# Patient Record
Sex: Female | Born: 1970 | Race: Black or African American | Hispanic: No | Marital: Single | Smoking: Never smoker
Health system: Southern US, Community
[De-identification: ages and names within clinical notes are randomized; demographics above are authoritative.]

## PROBLEM LIST (undated history)

## (undated) DIAGNOSIS — C801 Malignant (primary) neoplasm, unspecified: Secondary | ICD-10-CM

## (undated) DIAGNOSIS — E119 Type 2 diabetes mellitus without complications: Secondary | ICD-10-CM

## (undated) HISTORY — PX: ABDOMINAL HYSTERECTOMY: SHX81

## (undated) HISTORY — PX: REDUCTION MAMMAPLASTY: SUR839

## (undated) HISTORY — PX: KIDNEY SURGERY: SHX687

---

## 2006-09-12 ENCOUNTER — Encounter: Admission: RE | Admit: 2006-09-12 | Discharge: 2006-09-12 | Payer: Self-pay | Admitting: Family Medicine

## 2006-11-15 ENCOUNTER — Encounter: Admission: RE | Admit: 2006-11-15 | Discharge: 2006-11-15 | Payer: Self-pay | Admitting: Family Medicine

## 2010-11-05 ENCOUNTER — Encounter: Payer: Self-pay | Admitting: Urology

## 2011-04-09 ENCOUNTER — Ambulatory Visit: Payer: Self-pay | Admitting: Internal Medicine

## 2011-04-15 ENCOUNTER — Ambulatory Visit: Payer: Self-pay | Admitting: Internal Medicine

## 2011-05-16 ENCOUNTER — Ambulatory Visit: Payer: Self-pay | Admitting: Internal Medicine

## 2011-06-16 ENCOUNTER — Ambulatory Visit: Payer: Self-pay | Admitting: Internal Medicine

## 2013-04-11 ENCOUNTER — Emergency Department (HOSPITAL_COMMUNITY): Payer: Worker's Compensation

## 2013-04-11 ENCOUNTER — Emergency Department (HOSPITAL_COMMUNITY)
Admission: EM | Admit: 2013-04-11 | Discharge: 2013-04-11 | Disposition: A | Discharge status: Home / Self Care | Payer: Worker's Compensation | Attending: Emergency Medicine | Admitting: Emergency Medicine

## 2013-04-11 ENCOUNTER — Encounter (HOSPITAL_COMMUNITY): Payer: Self-pay | Admitting: Emergency Medicine

## 2013-04-11 DIAGNOSIS — R110 Nausea: Secondary | ICD-10-CM | POA: Insufficient documentation

## 2013-04-11 DIAGNOSIS — H532 Diplopia: Secondary | ICD-10-CM | POA: Insufficient documentation

## 2013-04-11 DIAGNOSIS — W1809XA Striking against other object with subsequent fall, initial encounter: Secondary | ICD-10-CM | POA: Insufficient documentation

## 2013-04-11 DIAGNOSIS — Y990 Civilian activity done for income or pay: Secondary | ICD-10-CM | POA: Insufficient documentation

## 2013-04-11 DIAGNOSIS — Z85528 Personal history of other malignant neoplasm of kidney: Secondary | ICD-10-CM | POA: Insufficient documentation

## 2013-04-11 DIAGNOSIS — S060X0A Concussion without loss of consciousness, initial encounter: Secondary | ICD-10-CM | POA: Insufficient documentation

## 2013-04-11 DIAGNOSIS — Y9289 Other specified places as the place of occurrence of the external cause: Secondary | ICD-10-CM | POA: Insufficient documentation

## 2013-04-11 DIAGNOSIS — Y9389 Activity, other specified: Secondary | ICD-10-CM | POA: Insufficient documentation

## 2013-04-11 DIAGNOSIS — R42 Dizziness and giddiness: Secondary | ICD-10-CM | POA: Insufficient documentation

## 2013-04-11 DIAGNOSIS — Z79899 Other long term (current) drug therapy: Secondary | ICD-10-CM | POA: Insufficient documentation

## 2013-04-11 HISTORY — DX: Malignant (primary) neoplasm, unspecified: C80.1

## 2013-04-11 MED ORDER — ONDANSETRON 8 MG PO TBDP
8.0000 mg | ORAL_TABLET | Freq: Once | ORAL | Status: AC
  Administered 2013-04-11: 8 mg via ORAL
  Filled 2013-04-11: qty 1

## 2013-04-11 MED ORDER — OXYCODONE-ACETAMINOPHEN 5-325 MG PO TABS
1.0000 | ORAL_TABLET | Freq: Once | ORAL | Status: AC
  Administered 2013-04-11: 1 via ORAL
  Filled 2013-04-11: qty 1

## 2013-04-11 MED ORDER — ONDANSETRON HCL 4 MG PO TABS: 4.0000 mg | ORAL_TABLET | Freq: Four times a day (QID) | ORAL | Status: DC

## 2013-04-11 NOTE — ED Notes (Signed)
As per EMS , pt was at work when she slipped and fell striking her head on floor, No LOC,N/V, neck pain. Pt was seeing double vision.pt is alert and orientated x4

## 2013-04-11 NOTE — ED Provider Notes (Signed)
History    CSN: [id number] Arrival date & time 04/11/13  5284  First MD Initiated Contact with Patient 04/11/13 [id number]     Chief Complaint  Patient presents with  . Fall   (Consider location/radiation/quality/duration/timing/severity/associated sxs/prior Treatment) HPI Comments: Patient is a 42 year old female past medical history significant for renal cancer presenting to the emergency department after slipping at work and falling and striking the back of her head around 4AM today. Patient denies any loss of consciousness or neck pain/stiffness. Patient states she has associated dizziness, double vision, nausea. Patient states dizziness double vision and improved but remains nauseous. Patient states she is nonradiating posterior throbbing head pain that she rates 10 out of 10 with no alleviating or aggravating factors. Patient denies any history of head trauma in the past. Patient denies any chest pain shortness of breath or feeling unwell prior to fall. Patient is not on any blood thinners. Denies fevers or chills.     Past Medical History  Diagnosis Date  . Cancer    Past Surgical History  Procedure Laterality Date  . Kidney surgery     No family history on file. History  Substance Use Topics  . Smoking status: Never Smoker   . Smokeless tobacco: Never Used  . Alcohol Use: No   OB History   Grav Para Term Preterm Abortions TAB SAB Ect Mult Living                 Review of Systems  Constitutional: Negative for fever and chills.  HENT: Negative for neck pain and neck stiffness.   Eyes: Negative for visual disturbance.  Respiratory: Negative for shortness of breath.   Cardiovascular: Negative for chest pain.  Gastrointestinal: Positive for nausea.  Genitourinary: Negative.   Musculoskeletal: Negative.   Skin: Negative.   Neurological: Positive for headaches. Negative for syncope, speech difficulty, weakness and numbness.    Allergies  Review of patient's allergies  indicates no known allergies.  Home Medications   Current Outpatient Rx  Name  Route  Sig  Dispense  Refill  . cholecalciferol (VITAMIN D) 1000 UNITS tablet   Oral   Take 1,000 Units by mouth every morning.         . tamsulosin (FLOMAX) 0.4 MG CAPS   Oral   Take 0.4 mg by mouth daily after supper.         . ondansetron (ZOFRAN) 4 MG tablet   Oral   Take 1 tablet (4 mg total) by mouth every 6 (six) hours.   12 tablet   0    BP 150/66  Pulse 71  Temp(Src) 98.6 F (37 C) (Oral)  Resp 18  Ht 5\' 6"  (1.676 m)  Wt 230 lb (104.327 kg)  BMI 37.14 kg/m2  SpO2 95%  LMP 03/21/2013 Physical Exam  Constitutional: She is oriented to person, place, and time. She appears well-developed and well-nourished. No distress.  HENT:  Head: Normocephalic and atraumatic.  Eyes: Conjunctivae and EOM are normal. Pupils are equal, round, and reactive to light.  Neck: Normal range of motion and full passive range of motion without pain. Neck supple. No spinous process tenderness and no muscular tenderness present.  Cardiovascular: Normal rate, regular rhythm, normal heart sounds and intact distal pulses.   Pulmonary/Chest: Effort normal and breath sounds normal. No respiratory distress.  Abdominal: Soft. Bowel sounds are normal. There is no tenderness.  Musculoskeletal: She exhibits no tenderness.  Lymphadenopathy:    She has no cervical adenopathy.  Neurological:  She is alert and oriented to person, place, and time. She has normal strength. No cranial nerve deficit or sensory deficit. Gait normal. GCS eye subscore is 4. GCS verbal subscore is 5. GCS motor subscore is 6.  No pronator drift. Heel-knee-shin intact bilaterally. Able to ambulate w/o difficulty.   Skin: Skin is warm and dry. She is not diaphoretic.    ED Course  Procedures (including critical care time)  Medications  ondansetron (ZOFRAN-ODT) disintegrating tablet 8 mg (not administered)  oxyCODONE-acetaminophen  (PERCOCET/ROXICET) 5-325 MG per tablet 1 tablet (not administered)  oxyCODONE-acetaminophen (PERCOCET/ROXICET) 5-325 MG per tablet 1 tablet (1 tablet Oral Given 04/11/13 0547)  ondansetron (ZOFRAN-ODT) disintegrating tablet 8 mg (8 mg Oral Given 04/11/13 0555)   Symptoms managed in ED.   Labs Reviewed - No data to display Ct Head Wo Contrast  04/11/2013   *RADIOLOGY REPORT*  Clinical Data: Trauma to head.  Episode of diplopia.  CT HEAD WITHOUT CONTRAST  Technique:  Contiguous axial images were obtained from the base of the skull through the vertex without contrast.  Comparison: None.  Findings: Mild soft tissue swelling is present in the posterior parietal scalp near the vertex.  There is no underlying fracture. The paranasal sinuses and mastoid air cells are clear.  The osseous skull is intact.  No acute infarct, hemorrhage, or mass lesion is present.  The ventricles are of normal size.  No significant extra-axial fluid collection is present.  IMPRESSION:  1.  Normal CT appearance of the brain. 2.  Mild posterior parietal scalp soft tissue swelling without an underlying fracture.   Original Report Authenticated By: Marin Roberts, M.D.   1. Concussion without loss of consciousness, initial encounter     MDM  GCS 15, A&Ox4, no bleeding from the head, battle signs, or clear discharge resembling CSF fluid.  No focal neurological deficits on physical exam. CT image negative. Pt is hemodynamically stable. Pain managed in the ED. At this time there does not appear to be any evidence of an acute emergency medical condition and the patient appears stable for discharge with appropriate outpatient follow up as patient states she has a PCP to f/u w/. Discussed returning to the ED upon presentation of any concerning symptoms and the dangers and symptoms of post-concussive syndrome (including but not limited to severe headaches, disequilibrium/difficulty walking, double vision, difficulty concentrating,  sensitivity to light, changes in mood, nausea/vomiting, ongoing dizziness) as well as second-impact syndrome and how that can lead to devastating brain injury. Discussed the importance of patient being symptom free for at least one week and being cleared by their primary care physician before returning to sports and if symptoms return upon exertion to stop activity immediately and follow up with their doctor or return to ED. Pt verbalized understanding and is agreeable to discharge. Pt case discussed with Dr. Patria Mane who agrees with my plan. Patient is stable at time of discharge.      Jeannetta Ellis, PA-C 04/11/13 [phone]

## 2013-04-11 NOTE — ED Notes (Signed)
[id number]> Expected date:<BR> Expected time:<BR> Means of arrival:<BR> Comments:<BR> EMS - 41 F Slip &amp; Fall

## 2013-04-12 NOTE — ED Provider Notes (Signed)
Medical screening examination/treatment/procedure(s) were performed by non-physician practitioner and as supervising physician I was immediately available for consultation/collaboration.  Derald Lorge M Davontae Prusinski, MD 04/12/13 0026 

## 2013-04-13 ENCOUNTER — Telehealth (HOSPITAL_COMMUNITY): Payer: Self-pay | Admitting: Emergency Medicine

## 2018-12-09 ENCOUNTER — Other Ambulatory Visit: Payer: Self-pay | Admitting: Obstetrics & Gynecology

## 2018-12-09 ENCOUNTER — Other Ambulatory Visit: Payer: Self-pay | Admitting: Obstetrics and Gynecology

## 2018-12-09 DIAGNOSIS — Z1231 Encounter for screening mammogram for malignant neoplasm of breast: Secondary | ICD-10-CM

## 2018-12-10 ENCOUNTER — Ambulatory Visit
Admission: RE | Admit: 2018-12-10 | Discharge: 2018-12-10 | Disposition: A | Discharge status: Home / Self Care | Payer: 59 | Source: Ambulatory Visit | Attending: Obstetrics & Gynecology | Admitting: Obstetrics & Gynecology

## 2018-12-10 DIAGNOSIS — Z1231 Encounter for screening mammogram for malignant neoplasm of breast: Secondary | ICD-10-CM

## 2018-12-12 ENCOUNTER — Other Ambulatory Visit: Payer: Self-pay | Admitting: Obstetrics & Gynecology

## 2018-12-12 DIAGNOSIS — R928 Other abnormal and inconclusive findings on diagnostic imaging of breast: Secondary | ICD-10-CM

## 2018-12-16 ENCOUNTER — Ambulatory Visit
Admission: RE | Admit: 2018-12-16 | Discharge: 2018-12-16 | Disposition: A | Discharge status: Home / Self Care | Payer: 59 | Source: Ambulatory Visit | Attending: Obstetrics & Gynecology | Admitting: Obstetrics & Gynecology

## 2018-12-16 ENCOUNTER — Other Ambulatory Visit: Payer: Self-pay | Admitting: Obstetrics & Gynecology

## 2018-12-16 ENCOUNTER — Other Ambulatory Visit: Payer: Self-pay

## 2018-12-16 DIAGNOSIS — R590 Localized enlarged lymph nodes: Secondary | ICD-10-CM

## 2018-12-16 DIAGNOSIS — R928 Other abnormal and inconclusive findings on diagnostic imaging of breast: Secondary | ICD-10-CM

## 2018-12-23 ENCOUNTER — Other Ambulatory Visit: Payer: Self-pay | Admitting: Obstetrics & Gynecology

## 2019-03-20 ENCOUNTER — Other Ambulatory Visit: Payer: Self-pay

## 2019-09-13 ENCOUNTER — Encounter (HOSPITAL_COMMUNITY): Payer: Self-pay | Admitting: Obstetrics and Gynecology

## 2019-09-13 ENCOUNTER — Emergency Department (HOSPITAL_COMMUNITY): Payer: BC Managed Care – PPO

## 2019-09-13 ENCOUNTER — Inpatient Hospital Stay (HOSPITAL_COMMUNITY)
Admission: EM | Admit: 2019-09-13 | Discharge: 2019-09-17 | DRG: 871 | Disposition: A | Discharge status: Home / Self Care | Payer: BC Managed Care – PPO | Attending: Internal Medicine | Admitting: Internal Medicine

## 2019-09-13 ENCOUNTER — Other Ambulatory Visit: Payer: Self-pay

## 2019-09-13 DIAGNOSIS — U071 COVID-19: Secondary | ICD-10-CM | POA: Diagnosis present

## 2019-09-13 DIAGNOSIS — R1013 Epigastric pain: Secondary | ICD-10-CM

## 2019-09-13 DIAGNOSIS — Y92230 Patient room in hospital as the place of occurrence of the external cause: Secondary | ICD-10-CM | POA: Diagnosis present

## 2019-09-13 DIAGNOSIS — J1289 Other viral pneumonia: Secondary | ICD-10-CM | POA: Diagnosis present

## 2019-09-13 DIAGNOSIS — N390 Urinary tract infection, site not specified: Secondary | ICD-10-CM | POA: Diagnosis present

## 2019-09-13 DIAGNOSIS — Z6831 Body mass index (BMI) 31.0-31.9, adult: Secondary | ICD-10-CM | POA: Diagnosis not present

## 2019-09-13 DIAGNOSIS — N83209 Unspecified ovarian cyst, unspecified side: Secondary | ICD-10-CM

## 2019-09-13 DIAGNOSIS — E669 Obesity, unspecified: Secondary | ICD-10-CM | POA: Diagnosis present

## 2019-09-13 DIAGNOSIS — E1169 Type 2 diabetes mellitus with other specified complication: Secondary | ICD-10-CM

## 2019-09-13 DIAGNOSIS — A419 Sepsis, unspecified organism: Secondary | ICD-10-CM | POA: Diagnosis not present

## 2019-09-13 DIAGNOSIS — A0839 Other viral enteritis: Secondary | ICD-10-CM | POA: Diagnosis present

## 2019-09-13 DIAGNOSIS — A4189 Other specified sepsis: Secondary | ICD-10-CM | POA: Diagnosis present

## 2019-09-13 DIAGNOSIS — E119 Type 2 diabetes mellitus without complications: Secondary | ICD-10-CM

## 2019-09-13 DIAGNOSIS — E876 Hypokalemia: Secondary | ICD-10-CM | POA: Diagnosis present

## 2019-09-13 DIAGNOSIS — N83201 Unspecified ovarian cyst, right side: Secondary | ICD-10-CM | POA: Diagnosis present

## 2019-09-13 DIAGNOSIS — Z79899 Other long term (current) drug therapy: Secondary | ICD-10-CM | POA: Diagnosis not present

## 2019-09-13 DIAGNOSIS — Z794 Long term (current) use of insulin: Secondary | ICD-10-CM

## 2019-09-13 DIAGNOSIS — T380X5A Adverse effect of glucocorticoids and synthetic analogues, initial encounter: Secondary | ICD-10-CM | POA: Diagnosis present

## 2019-09-13 DIAGNOSIS — J189 Pneumonia, unspecified organism: Secondary | ICD-10-CM

## 2019-09-13 DIAGNOSIS — J1282 Pneumonia due to coronavirus disease 2019: Secondary | ICD-10-CM | POA: Diagnosis present

## 2019-09-13 DIAGNOSIS — E0965 Drug or chemical induced diabetes mellitus with hyperglycemia: Secondary | ICD-10-CM | POA: Diagnosis present

## 2019-09-13 DIAGNOSIS — R109 Unspecified abdominal pain: Secondary | ICD-10-CM

## 2019-09-13 HISTORY — DX: Type 2 diabetes mellitus without complications: E11.9

## 2019-09-13 LAB — HEPATIC FUNCTION PANEL
ALT: 26 U/L (ref 0–44)
AST: 42 U/L — ABNORMAL HIGH (ref 15–41)
Albumin: 2.8 g/dL — ABNORMAL LOW (ref 3.5–5.0)
Alkaline Phosphatase: 41 U/L (ref 38–126)
Bilirubin, Direct: 0.2 mg/dL (ref 0.0–0.2)
Indirect Bilirubin: 1.8 mg/dL — ABNORMAL HIGH (ref 0.3–0.9)
Total Bilirubin: 2 mg/dL — ABNORMAL HIGH (ref 0.3–1.2)
Total Protein: 5.8 g/dL — ABNORMAL LOW (ref 6.5–8.1)

## 2019-09-13 LAB — CBC
HCT: 44.1 % (ref 36.0–46.0)
HCT: 40.4 % (ref 36.0–46.0)
Hemoglobin: 14.5 g/dL (ref 12.0–15.0)
Hemoglobin: 13.1 g/dL (ref 12.0–15.0)
MCH: 29.8 pg (ref 26.0–34.0)
MCH: 29 pg (ref 26.0–34.0)
MCHC: 32.9 g/dL (ref 30.0–36.0)
MCHC: 32.4 g/dL (ref 30.0–36.0)
MCV: 90.6 fL (ref 80.0–100.0)
MCV: 89.6 fL (ref 80.0–100.0)
Platelets: 124 10*3/uL — ABNORMAL LOW (ref 150–400)
Platelets: 115 10*3/uL — ABNORMAL LOW (ref 150–400)
RBC: 4.87 MIL/uL (ref 3.87–5.11)
RBC: 4.51 MIL/uL (ref 3.87–5.11)
RDW: 12.3 % (ref 11.5–15.5)
RDW: 12.4 % (ref 11.5–15.5)
WBC: 5.6 10*3/uL (ref 4.0–10.5)
WBC: 5.5 10*3/uL (ref 4.0–10.5)
nRBC: 0 % (ref 0.0–0.2)
nRBC: 0 % (ref 0.0–0.2)

## 2019-09-13 LAB — URINALYSIS, ROUTINE W REFLEX MICROSCOPIC
Bilirubin Urine: NEGATIVE
Glucose, UA: >=500 mg/dL — AB
Hgb urine dipstick: NEGATIVE
Ketones, ur: 80 mg/dL — AB
Nitrite: NEGATIVE
Protein, ur: 100 mg/dL — AB
Specific Gravity, Urine: 1.023 (ref 1.005–1.030)
pH: 6 (ref 5.0–8.0)

## 2019-09-13 LAB — D-DIMER, QUANTITATIVE: D-Dimer, Quant: 1.42 ug/mL-FEU — ABNORMAL HIGH (ref 0.00–0.50)

## 2019-09-13 LAB — I-STAT BETA HCG BLOOD, ED (MC, WL, AP ONLY): I-stat hCG, quantitative: <5 m[IU]/mL (ref <5)

## 2019-09-13 LAB — LACTATE DEHYDROGENASE: LDH: 365 U/L — ABNORMAL HIGH (ref 98–192)

## 2019-09-13 LAB — COMPREHENSIVE METABOLIC PANEL
ALT: 34 U/L (ref 0–44)
AST: 47 U/L — ABNORMAL HIGH (ref 15–41)
Albumin: 3.4 g/dL — ABNORMAL LOW (ref 3.5–5.0)
Alkaline Phosphatase: 53 U/L (ref 38–126)
Anion gap: 13 (ref 5–15)
BUN: 11 mg/dL (ref 6–20)
CO2: 21 mmol/L — ABNORMAL LOW (ref 22–32)
Calcium: 8.4 mg/dL — ABNORMAL LOW (ref 8.9–10.3)
Chloride: 99 mmol/L (ref 98–111)
Creatinine, Ser: 0.95 mg/dL (ref 0.44–1.00)
GFR calc Af Amer: >60 mL/min (ref >60)
GFR calc non Af Amer: >60 mL/min (ref >60)
Glucose, Bld: 261 mg/dL — ABNORMAL HIGH (ref 70–99)
Potassium: 3.2 mmol/L — ABNORMAL LOW (ref 3.5–5.1)
Sodium: 133 mmol/L — ABNORMAL LOW (ref 135–145)
Total Bilirubin: 0.7 mg/dL (ref 0.3–1.2)
Total Protein: 7.3 g/dL (ref 6.5–8.1)

## 2019-09-13 LAB — ABO/RH: ABO/RH(D): A POS

## 2019-09-13 LAB — INFLUENZA PANEL BY PCR (TYPE A & B)
Influenza A By PCR: NEGATIVE
Influenza B By PCR: NEGATIVE

## 2019-09-13 LAB — C-REACTIVE PROTEIN: CRP: 5.2 mg/dL — ABNORMAL HIGH (ref <1.0)

## 2019-09-13 LAB — PROCALCITONIN: Procalcitonin: <0.1 ng/mL

## 2019-09-13 LAB — PHOSPHORUS: Phosphorus: 3.1 mg/dL (ref 2.5–4.6)

## 2019-09-13 LAB — PROTIME-INR
INR: 1.1 (ref 0.8–1.2)
Prothrombin Time: 13.8 seconds (ref 11.4–15.2)

## 2019-09-13 LAB — CREATININE, SERUM
Creatinine, Ser: 0.83 mg/dL (ref 0.44–1.00)
GFR calc Af Amer: >60 mL/min (ref >60)
GFR calc non Af Amer: >60 mL/min (ref >60)

## 2019-09-13 LAB — TROPONIN I (HIGH SENSITIVITY)
Troponin I (High Sensitivity): 20 ng/L — ABNORMAL HIGH (ref <18)
Troponin I (High Sensitivity): 24 ng/L — ABNORMAL HIGH (ref <18)

## 2019-09-13 LAB — CBG MONITORING, ED
Glucose-Capillary: 250 mg/dL — ABNORMAL HIGH (ref 70–99)
Glucose-Capillary: 253 mg/dL — ABNORMAL HIGH (ref 70–99)
Glucose-Capillary: 272 mg/dL — ABNORMAL HIGH (ref 70–99)

## 2019-09-13 LAB — LACTIC ACID, PLASMA
Lactic Acid, Venous: 1 mmol/L (ref 0.5–1.9)
Lactic Acid, Venous: 0.9 mmol/L (ref 0.5–1.9)

## 2019-09-13 LAB — MAGNESIUM: Magnesium: 1.7 mg/dL (ref 1.7–2.4)

## 2019-09-13 LAB — POC SARS CORONAVIRUS 2 AG -  ED: SARS Coronavirus 2 Ag: POSITIVE — AB

## 2019-09-13 LAB — GLUCOSE, CAPILLARY: Glucose-Capillary: 347 mg/dL — ABNORMAL HIGH (ref 70–99)

## 2019-09-13 LAB — APTT: aPTT: 29 seconds (ref 24–36)

## 2019-09-13 LAB — HIV ANTIBODY (ROUTINE TESTING W REFLEX): HIV Screen 4th Generation wRfx: NONREACTIVE

## 2019-09-13 LAB — FERRITIN: Ferritin: 760 ng/mL — ABNORMAL HIGH (ref 11–307)

## 2019-09-13 MED ORDER — VANCOMYCIN HCL IN DEXTROSE 1-5 GM/200ML-% IV SOLN
1000.0000 mg | Freq: Once | INTRAVENOUS | Status: AC
  Administered 2019-09-13: 1000 mg via INTRAVENOUS
  Filled 2019-09-13: qty 200

## 2019-09-13 MED ORDER — POTASSIUM CHLORIDE IN NACL 40-0.9 MEQ/L-% IV SOLN
INTRAVENOUS | Status: DC
  Administered 2019-09-13 – 2019-09-14 (×2): 125 mL/h via INTRAVENOUS
  Administered 2019-09-15: 09:00:00 75 mL/h via INTRAVENOUS
  Filled 2019-09-13 (×4): qty 1000

## 2019-09-13 MED ORDER — POTASSIUM CHLORIDE CRYS ER 20 MEQ PO TBCR
40.0000 meq | EXTENDED_RELEASE_TABLET | Freq: Once | ORAL | Status: DC
  Filled 2019-09-13 (×2): qty 2

## 2019-09-13 MED ORDER — SODIUM CHLORIDE 0.9 % IV SOLN
2.0000 g | Freq: Three times a day (TID) | INTRAVENOUS | Status: DC
  Administered 2019-09-13 – 2019-09-14 (×2): 2 g via INTRAVENOUS
  Filled 2019-09-13 (×2): qty 2

## 2019-09-13 MED ORDER — ACETAMINOPHEN 325 MG PO TABS
650.0000 mg | ORAL_TABLET | Freq: Once | ORAL | Status: AC
  Administered 2019-09-13: 650 mg via ORAL
  Filled 2019-09-13: qty 2

## 2019-09-13 MED ORDER — ONDANSETRON HCL 4 MG/2ML IJ SOLN
4.0000 mg | Freq: Four times a day (QID) | INTRAMUSCULAR | Status: DC | PRN
  Filled 2019-09-13: qty 2

## 2019-09-13 MED ORDER — METHYLPREDNISOLONE SODIUM SUCC 125 MG IJ SOLR
60.0000 mg | Freq: Two times a day (BID) | INTRAMUSCULAR | Status: DC
  Administered 2019-09-13 – 2019-09-17 (×8): 60 mg via INTRAVENOUS
  Filled 2019-09-13 (×8): qty 2

## 2019-09-13 MED ORDER — SODIUM CHLORIDE 0.9 % IV BOLUS
1000.0000 mL | Freq: Once | INTRAVENOUS | Status: AC
  Administered 2019-09-13: 1000 mL via INTRAVENOUS

## 2019-09-13 MED ORDER — IOHEXOL 300 MG/ML  SOLN: 100.0000 mL | Freq: Once | INTRAMUSCULAR | Status: DC | PRN

## 2019-09-13 MED ORDER — INSULIN DETEMIR 100 UNIT/ML ~~LOC~~ SOLN
10.0000 [IU] | Freq: Every day | SUBCUTANEOUS | Status: DC
  Administered 2019-09-13: 10 [IU] via SUBCUTANEOUS
  Filled 2019-09-13: qty 0.1

## 2019-09-13 MED ORDER — INSULIN ASPART 100 UNIT/ML ~~LOC~~ SOLN
0.0000 [IU] | Freq: Three times a day (TID) | SUBCUTANEOUS | Status: DC
  Administered 2019-09-14: 11 [IU] via SUBCUTANEOUS
  Filled 2019-09-13: qty 0.15

## 2019-09-13 MED ORDER — SODIUM CHLORIDE 0.9% FLUSH
3.0000 mL | Freq: Two times a day (BID) | INTRAVENOUS | Status: DC
  Administered 2019-09-13 – 2019-09-17 (×5): 3 mL via INTRAVENOUS

## 2019-09-13 MED ORDER — SODIUM CHLORIDE 0.9 % IV SOLN
2.0000 g | Freq: Once | INTRAVENOUS | Status: AC
  Administered 2019-09-13: 2 g via INTRAVENOUS
  Filled 2019-09-13: qty 2

## 2019-09-13 MED ORDER — INSULIN ASPART 100 UNIT/ML ~~LOC~~ SOLN
0.0000 [IU] | Freq: Every day | SUBCUTANEOUS | Status: DC
  Administered 2019-09-13: 3 [IU] via SUBCUTANEOUS
  Filled 2019-09-13: qty 0.05

## 2019-09-13 MED ORDER — SODIUM CHLORIDE 0.9% FLUSH: 3.0000 mL | INTRAVENOUS | Status: DC | PRN

## 2019-09-13 MED ORDER — SODIUM CHLORIDE 0.9 % IV SOLN
200.0000 mg | Freq: Once | INTRAVENOUS | Status: AC
  Administered 2019-09-13: 200 mg via INTRAVENOUS
  Filled 2019-09-13: qty 40

## 2019-09-13 MED ORDER — ENOXAPARIN SODIUM 40 MG/0.4ML ~~LOC~~ SOLN
40.0000 mg | SUBCUTANEOUS | Status: DC
  Administered 2019-09-13 – 2019-09-16 (×4): 40 mg via SUBCUTANEOUS
  Filled 2019-09-13 (×4): qty 0.4

## 2019-09-13 MED ORDER — SODIUM CHLORIDE 0.9 % IV SOLN: 250.0000 mL | INTRAVENOUS | Status: DC | PRN

## 2019-09-13 MED ORDER — ONDANSETRON HCL 4 MG PO TABS
4.0000 mg | ORAL_TABLET | Freq: Four times a day (QID) | ORAL | Status: DC | PRN
  Administered 2019-09-16: 10:00:00 4 mg via ORAL

## 2019-09-13 MED ORDER — SODIUM CHLORIDE 0.9 % IV SOLN
100.0000 mg | INTRAVENOUS | Status: DC
  Administered 2019-09-14: 100 mg via INTRAVENOUS
  Filled 2019-09-13: qty 100
  Filled 2019-09-13: qty 20

## 2019-09-13 MED ORDER — METRONIDAZOLE IN NACL 5-0.79 MG/ML-% IV SOLN
500.0000 mg | Freq: Once | INTRAVENOUS | Status: AC
  Administered 2019-09-13: 500 mg via INTRAVENOUS
  Filled 2019-09-13: qty 100

## 2019-09-13 MED ORDER — VANCOMYCIN HCL IN DEXTROSE 750-5 MG/150ML-% IV SOLN
750.0000 mg | Freq: Two times a day (BID) | INTRAVENOUS | Status: DC
  Administered 2019-09-13 – 2019-09-14 (×2): 750 mg via INTRAVENOUS
  Filled 2019-09-13 (×2): qty 150

## 2019-09-13 MED ORDER — INSULIN DETEMIR 100 UNIT/ML FLEXPEN: 5.0000 [IU] | PEN_INJECTOR | Freq: Every day | SUBCUTANEOUS | Status: DC

## 2019-09-13 MED ORDER — IOHEXOL 350 MG/ML SOLN
100.0000 mL | Freq: Once | INTRAVENOUS | Status: AC | PRN
  Administered 2019-09-13: 100 mL via INTRAVENOUS

## 2019-09-13 NOTE — ED Provider Notes (Signed)
[place] DEPT Provider Note   CSN: [id number] Arrival date & time: 09/13/19  1207     History   Chief Complaint Chief Complaint  Patient presents with   Hyperglycemia   Fever    HPI Joy Phillips is a 48 y.o. female presents today with complaints of hyperglycemia and fever.  Patient reports that over the past 3 days she has developed cough, shortness of breath and upper abdominal pain.  She describes a nonproductive intermittent cough with no clear aggravating or alleviating factors.  Patient reports that she feels short of breath after coughing or exertion.  She reports she has never felt this way before.  Additionally patient describes upper abdominal pain and aching sensation constant worsened with palpation no clear alleviating factors, she reports that her abdominal pain is also worse with her cough.  She reports few episodes of vomiting none bloody yesterday none today.  Few episodes of nonbloody diarrhea today  Of note patient reports that her sister who she lives with was admitted to [facility] several days ago for pneumonia, reportedly Covid negative per patient.  Patient denies fever prior to arrival, chills, headache/neck pain, chest pain, active vomiting, dysuria, hematuria, fall/injury, hemoptysis, extremity swelling/color change or any additional concerns.     HPI  Past Medical History:  Diagnosis Date   Cancer [facility])     Patient Active Problem List   Diagnosis Date Noted   Pneumonia due to COVID-19 virus 09/13/2019    Past Surgical History:  Procedure Laterality Date   KIDNEY SURGERY       OB History   No obstetric history on file.      Home Medications    Prior to Admission medications   Medication Sig Start Date End Date Taking? Authorizing Provider  cholecalciferol (VITAMIN D) 1000 UNITS tablet Take 1,000 Units by mouth every morning.    [provider]  ondansetron (ZOFRAN) 4 MG tablet Take 1 tablet (4 mg  total) by mouth every 6 (six) hours. 04/11/13   Piepenbrink, Anderson Malta, PA-C  tamsulosin (FLOMAX) 0.4 MG CAPS Take 0.4 mg by mouth daily after supper.    [provider]    Family History No family history on file.  Social History Social History   Tobacco Use   Smoking status: Never Smoker   Smokeless tobacco: Never Used  Substance Use Topics   Alcohol use: No   Drug use: No     Allergies   Patient has no known allergies.   Review of Systems Review of Systems Ten systems are reviewed and are negative for acute change except as noted in the HPI   Physical Exam Updated Vital Signs BP 129/64    Pulse 89    Temp (!) 102.3 F (39.1 C) (Oral)    Resp (!) 23    Ht 5\' 7"  (1.702 m)    Wt 92.1 kg    SpO2 96%    BMI 31.79 kg/m   Physical Exam Constitutional:      General: She is not in acute distress.    Appearance: Normal appearance. She is well-developed. She is obese. She is not ill-appearing or diaphoretic.  HENT:     Head: Normocephalic and atraumatic.     Right Ear: External ear normal.     Left Ear: External ear normal.     Nose: Nose normal.  Eyes:     General: Vision grossly intact. Gaze aligned appropriately.     Pupils: Pupils are equal, round, and  reactive to light.  Neck:     Musculoskeletal: Normal range of motion.     Trachea: Trachea and phonation normal. No tracheal deviation.  Cardiovascular:     Rate and Rhythm: Regular rhythm. Tachycardia present.     Pulses: Normal pulses.     Heart sounds: Normal heart sounds.  Pulmonary:     Effort: Pulmonary effort is normal. No accessory muscle usage or respiratory distress.     Breath sounds: Decreased breath sounds present.  Abdominal:     General: There is no distension.     Palpations: Abdomen is soft.     Tenderness: There is generalized abdominal tenderness. There is no guarding or rebound. Negative signs include Rovsing's sign and McBurney's sign.  Musculoskeletal: Normal range of motion.    Skin:    General: Skin is warm and dry.  Neurological:     Mental Status: She is alert.     GCS: GCS eye subscore is 4. GCS verbal subscore is 5. GCS motor subscore is 6.     Comments: Speech is clear and goal oriented, follows commands Major Cranial nerves without deficit, no facial droop Moves extremities without ataxia, coordination intact  Psychiatric:        Behavior: Behavior normal.      ED Treatments / Results  Labs (all labs ordered are listed, but only abnormal results are displayed) Labs Reviewed  CBC - Abnormal; Notable for the following components:      Result Value   Platelets 124 (*)    All other components within normal limits  URINALYSIS, ROUTINE W REFLEX MICROSCOPIC - Abnormal; Notable for the following components:   Color, Urine AMBER (*)    APPearance HAZY (*)    Glucose, UA >=500 (*)    Ketones, ur 80 (*)    Protein, ur 100 (*)    Leukocytes,Ua MODERATE (*)    Bacteria, UA RARE (*)    All other components within normal limits  COMPREHENSIVE METABOLIC PANEL - Abnormal; Notable for the following components:   Sodium 133 (*)    Potassium 3.2 (*)    CO2 21 (*)    Glucose, Bld 261 (*)    Calcium 8.4 (*)    Albumin 3.4 (*)    AST 47 (*)    All other components within normal limits  D-DIMER, QUANTITATIVE (NOT AT [facility]) - Abnormal; Notable for the following components:   D-Dimer, Quant 1.42 (*)    All other components within normal limits  CBG MONITORING, ED - Abnormal; Notable for the following components:   Glucose-Capillary 250 (*)    All other components within normal limits  POC SARS CORONAVIRUS 2 AG -  ED - Abnormal; Notable for the following components:   SARS Coronavirus 2 Ag POSITIVE (*)    All other components within normal limits  CBG MONITORING, ED - Abnormal; Notable for the following components:   Glucose-Capillary 253 (*)    All other components within normal limits  TROPONIN I (HIGH SENSITIVITY) - Abnormal; Notable for the following  components:   Troponin I (High Sensitivity) 20 (*)    All other components within normal limits  TROPONIN I (HIGH SENSITIVITY) - Abnormal; Notable for the following components:   Troponin I (High Sensitivity) 24 (*)    All other components within normal limits  CULTURE, BLOOD (ROUTINE X 2)  CULTURE, BLOOD (ROUTINE X 2)  URINE CULTURE  RESPIRATORY PANEL BY PCR  LACTIC ACID, PLASMA  LACTIC ACID, PLASMA  APTT  PROTIME-INR  FERRITIN  C-REACTIVE PROTEIN  HEPATIC FUNCTION PANEL  PROCALCITONIN  INFLUENZA PANEL BY PCR (TYPE A & B)  MAGNESIUM  PHOSPHORUS  HIV ANTIBODY (ROUTINE TESTING W REFLEX)  CBC  CREATININE, SERUM  LACTATE DEHYDROGENASE  CBC WITH DIFFERENTIAL/PLATELET  COMPREHENSIVE METABOLIC PANEL  C-REACTIVE PROTEIN  D-DIMER, QUANTITATIVE (NOT AT [facility])  FERRITIN  MAGNESIUM  PHOSPHORUS  I-STAT BETA HCG BLOOD, ED ([facility], [facility], [facility] ONLY)  ABO/RH    EKG EKG Interpretation  Date/Time:  Sunday September 13 2019 13:50:11 EST Ventricular Rate:  96 PR Interval:    QRS Duration: 91 QT Interval:  317 QTC Calculation: 401 R Axis:   -49 Text Interpretation: Sinus rhythm RAE, consider biatrial enlargement Left anterior fascicular block Probable anteroseptal infarct, old No old tracing to compare Confirmed by Lacretia Leigh ([id number]) on 09/13/2019 3:03:48 PM   Radiology Ct Angio Chest Pe W And/or Wo Contrast  Result Date: 09/13/2019 CLINICAL DATA:  Patient with history of COVID-19. Abdominal pain. Evaluate for pulmonary embolus. EXAM: CT CHEST, ABDOMEN, AND PELVIS WITH CONTRAST TECHNIQUE: Multidetector CT imaging of the chest, abdomen and pelvis was performed following the standard protocol during bolus administration of intravenous contrast. CONTRAST:  142mL OMNIPAQUE IOHEXOL 350 MG/ML SOLN COMPARISON:  CT abdomen pelvis 12/15/2009. FINDINGS: CT CHEST FINDINGS Cardiovascular: Normal heart size. Aorta main pulmonary artery normal in caliber. Adequate opacification of the pulmonary arterial  system. Motion artifact limits evaluation. Within the above limitation, no intraluminal filling defect identified to suggest acute pulmonary embolus. Mediastinum/Nodes: No enlarged axillary, mediastinal or hilar lymphadenopathy. Normal appearance of the esophagus. Lungs/Pleura: Central airways are patent. Sharply marginated patchy areas of ground-glass attenuation are demonstrated within the lungs bilaterally. No pleural effusion or pneumothorax. Musculoskeletal: No aggressive or acute appearing osseous lesions. CT ABDOMEN PELVIS FINDINGS Hepatobiliary: The liver is normal in size and contour. Gallbladder is unremarkable. No intrahepatic or extrahepatic biliary ductal dilatation. Pancreas: Unremarkable Spleen: Unremarkable Adrenals/Urinary Tract: Normal adrenal glands. Kidneys enhance symmetrically with contrast. Postsurgical changes involving the midpole of the right kidney. Subcentimeter too small to characterize low-attenuation lesion superior pole right kidney. Urinary bladder is unremarkable. Stomach/Bowel: There are a few fluid-filled loops of small bowel (2 cm) within the left hemiabdomen (image 45; series 3). No abnormal bowel wall thickening. No free fluid or free intraperitoneal air. Normal morphology of the stomach. Vascular/Lymphatic: Normal caliber abdominal aorta. No retroperitoneal lymphadenopathy. Reproductive: Status post hysterectomy. The right ovary is enlarged measuring up to 5.7 x 3.8 cm and contains multiple cystic structures. Prominent follicle within the left ovary. Other: None. Musculoskeletal: Lumbar spine degenerative changes. No aggressive or acute appearing osseous lesions. IMPRESSION: 1. No evidence for acute pulmonary embolus. 2. Bilateral patchy areas of ground-glass and consolidation most compatible with history of COVID-19. 3. Few fluid-filled loops of bowel within the left hemiabdomen are nonspecific however may be secondary to ileus. 4. The right ovary is enlarged and contains  multiple cystic structures. This needs further evaluation pelvic ultrasound. Electronically Signed   By: Lovey Newcomer M.D.   On: 09/13/2019 16:43   Ct Abdomen Pelvis W Contrast  Result Date: 09/13/2019 CLINICAL DATA:  Patient with history of COVID-19. Abdominal pain. Evaluate for pulmonary embolus. EXAM: CT CHEST, ABDOMEN, AND PELVIS WITH CONTRAST TECHNIQUE: Multidetector CT imaging of the chest, abdomen and pelvis was performed following the standard protocol during bolus administration of intravenous contrast. CONTRAST:  133mL OMNIPAQUE IOHEXOL 350 MG/ML SOLN COMPARISON:  CT abdomen pelvis 12/15/2009. FINDINGS: CT CHEST FINDINGS Cardiovascular: Normal heart size. Aorta main  pulmonary artery normal in caliber. Adequate opacification of the pulmonary arterial system. Motion artifact limits evaluation. Within the above limitation, no intraluminal filling defect identified to suggest acute pulmonary embolus. Mediastinum/Nodes: No enlarged axillary, mediastinal or hilar lymphadenopathy. Normal appearance of the esophagus. Lungs/Pleura: Central airways are patent. Sharply marginated patchy areas of ground-glass attenuation are demonstrated within the lungs bilaterally. No pleural effusion or pneumothorax. Musculoskeletal: No aggressive or acute appearing osseous lesions. CT ABDOMEN PELVIS FINDINGS Hepatobiliary: The liver is normal in size and contour. Gallbladder is unremarkable. No intrahepatic or extrahepatic biliary ductal dilatation. Pancreas: Unremarkable Spleen: Unremarkable Adrenals/Urinary Tract: Normal adrenal glands. Kidneys enhance symmetrically with contrast. Postsurgical changes involving the midpole of the right kidney. Subcentimeter too small to characterize low-attenuation lesion superior pole right kidney. Urinary bladder is unremarkable. Stomach/Bowel: There are a few fluid-filled loops of small bowel (2 cm) within the left hemiabdomen (image 45; series 3). No abnormal bowel wall thickening. No  free fluid or free intraperitoneal air. Normal morphology of the stomach. Vascular/Lymphatic: Normal caliber abdominal aorta. No retroperitoneal lymphadenopathy. Reproductive: Status post hysterectomy. The right ovary is enlarged measuring up to 5.7 x 3.8 cm and contains multiple cystic structures. Prominent follicle within the left ovary. Other: None. Musculoskeletal: Lumbar spine degenerative changes. No aggressive or acute appearing osseous lesions. IMPRESSION: 1. No evidence for acute pulmonary embolus. 2. Bilateral patchy areas of ground-glass and consolidation most compatible with history of COVID-19. 3. Few fluid-filled loops of bowel within the left hemiabdomen are nonspecific however may be secondary to ileus. 4. The right ovary is enlarged and contains multiple cystic structures. This needs further evaluation pelvic ultrasound. Electronically Signed   By: Lovey Newcomer M.D.   On: 09/13/2019 16:43   Dg Chest Port 1 View  Result Date: 09/13/2019 CLINICAL DATA:  Abdominal pain. Sepsis. EXAM: PORTABLE CHEST 1 VIEW COMPARISON:  None. FINDINGS: Bilateral pulmonary infiltrates are identified. No pneumothorax. The heart, hila, mediastinum, lungs, and pleura are otherwise normal. IMPRESSION: Bilateral pulmonary infiltrates are identified. Atypical infections should be considered. Electronically Signed   By: Dorise Bullion III M.D   On: 09/13/2019 13:32    Procedures .Critical Care Performed by: Deliah Boston, PA-C Authorized by: Deliah Boston, PA-C   Critical care provider statement:    Critical care time (minutes):  46   Critical care was necessary to treat or prevent imminent or life-threatening deterioration of the following conditions:  Sepsis (Sepsis, COVID-19)   Critical care was time spent personally by me on the following activities:  Discussions with consultants, evaluation of patient's response to treatment, examination of patient, ordering and performing treatments and  interventions, ordering and review of laboratory studies, ordering and review of radiographic studies, pulse oximetry, re-evaluation of patient's condition, obtaining history from patient or surrogate and review of old charts   (including critical care time)  Medications Ordered in ED Medications  methylPREDNISolone sodium succinate (SOLU-MEDROL) 125 mg/2 mL injection 60 mg (has no administration in time range)  enoxaparin (LOVENOX) injection 40 mg (has no administration in time range)  sodium chloride flush (NS) 0.9 % injection 3 mL (has no administration in time range)  sodium chloride flush (NS) 0.9 % injection 3 mL (has no administration in time range)  0.9 %  sodium chloride infusion (has no administration in time range)  0.9 % NaCl with KCl 40 mEq / L  infusion (has no administration in time range)  ondansetron (ZOFRAN) tablet 4 mg (has no administration in time range)    Or  ondansetron (ZOFRAN) injection 4 mg (has no administration in time range)  ceFEPIme (MAXIPIME) 2 g in sodium chloride 0.9 % 100 mL IVPB (0 g Intravenous Stopped 09/13/19 1350)  metroNIDAZOLE (FLAGYL) IVPB 500 mg (0 mg Intravenous Stopped 09/13/19 1619)  vancomycin (VANCOCIN) IVPB 1000 mg/200 mL premix (0 mg Intravenous Stopped 09/13/19 1721)  acetaminophen (TYLENOL) tablet 650 mg (650 mg Oral Given 09/13/19 1334)  sodium chloride 0.9 % bolus 1,000 mL (0 mLs Intravenous Stopped 09/13/19 1619)  iohexol (OMNIPAQUE) 350 MG/ML injection 100 mL (100 mLs Intravenous Contrast Given 09/13/19 1540)     Initial Impression / Assessment and Plan / ED Course  I have reviewed the triage vital signs and the nursing notes.  Pertinent labs & imaging results that were available during my care of the patient were reviewed by me and considered in my medical decision making (see chart for details).  Clinical Course as of Sep 12 1726  Joy Phillips Sep 13, 2019  1709 Dr. Wynelle Cleveland   [BM]    Clinical Course User Index [BM] Deliah Boston, PA-C   Patient arrives febrile and tachycardic 3-day history of cough, shortness of breath and upper abdominal pain.  She is tired appearing but nontoxic.  Reportedly her sister is in the hospital with pneumonia but not Covid?  Code sepsis initiated on arrival broad-spectrum antibiotics ordered for currently unknown source.  Will obtain CT abdomen pelvis as well as chest x-ray.  1 L fluids ordered at this time, she is not hypotensive will avoid the full 30 cc/kg bolus for now because there is a high concern of Covid viral infection on initial evaluation and will need to not fluid overload. - Covid positive CBG 253 Beta-hCG negative D-dimer 1.42 APTT within normal limits PT/INR within normal limits Blood cultures pending Lactic 1.0 Troponin 20 CMP with elevated glucose, nonacute Urinalysis with sugar, ketones, protein, leukocytes, WBCs, rare bacteria, mildly contaminated but does suggest UTI and dehydration, urine culture pending CBC with platelets 124 otherwise within normal limits Chest x-ray:  IMPRESSION:  Bilateral pulmonary infiltrates are identified. Atypical infections  should be considered.  - Patient seen and evaluated by Dr. Zenia Resides who agrees with work-up and admission to hospitalist service. - Patient reassessed resting comfortably no acute distress vital signs improving, no longer tachycardic.  O2 saturation remains greater than 95% on room air.  CT PE study and abdomen pelvis pending.  Patient is agreeable for admission. - CT chest PE study and abdomen/pelvis:  IMPRESSION:  1. No evidence for acute pulmonary embolus.  2. Bilateral patchy areas of ground-glass and consolidation most  compatible with history of COVID-19.  3. Few fluid-filled loops of bowel within the left hemiabdomen are  nonspecific however may be secondary to ileus.  4. The right ovary is enlarged and contains multiple cystic  structures. This needs further evaluation pelvic ultrasound.  - 4:50 PM:  Patient reevaluated resting comfortably no acute distress reports she is feeling improved.  She has been updated on findings as above and states understanding.  Pelvic ultrasound ordered patient is agreeable to the study.  Consult placed to hospitalist for admission. - Discussed case with hospitalist Dr. Wynelle Cleveland will be seeing patient for admission.  Joy Phillips was evaluated in Emergency Department on 09/13/2019 for the symptoms described in the history of present illness. She was evaluated in the context of the global COVID-19 pandemic, which necessitated consideration that the patient might be at risk for infection with the SARS-CoV-2 virus that causes COVID-19. Institutional protocols  and algorithms that pertain to the evaluation of patients at risk for COVID-19 are in a state of rapid change based on information released by regulatory bodies including the CDC and federal and state organizations. These policies and algorithms were followed during the patient's care in the ED.   Note: Portions of this report may have been transcribed using voice recognition software. Every effort was made to ensure accuracy; however, inadvertent computerized transcription errors may still be present.  Final Clinical Impressions(s) / ED Diagnoses   Final diagnoses:  [id number] virus detected  Sepsis, due to unspecified organism, unspecified whether acute organ dysfunction present [facility])  Community acquired pneumonia, unspecified laterality    ED Discharge Orders    None       Gari Crown 09/13/19 1728    Lacretia Leigh, MD 09/15/19 1058

## 2019-09-13 NOTE — Progress Notes (Signed)
A consult was received from an ED physician for vanc/cefepime per pharmacy dosing.  The patient's profile has been reviewed for ht/wt/allergies/indication/available labs.   A one time order has been placed for vanc 1g and cefepime 2g.  Further antibiotics/pharmacy consults should be ordered by admitting physician if indicated.                       Thank you, Kara Mead 09/13/2019  12:42 PM

## 2019-09-13 NOTE — ED Provider Notes (Signed)
Medical screening examination/treatment/procedure(s) were conducted as a shared visit with non-physician practitioner(s) and myself.  I personally evaluated the patient during the encounter.  EKG Interpretation  Date/Time:  Sunday September 13 2019 13:50:11 EST Ventricular Rate:  96 PR Interval:    QRS Duration: 91 QT Interval:  317 QTC Calculation: 401 R Axis:   -49 Text Interpretation: Sinus rhythm RAE, consider biatrial enlargement Left anterior fascicular block Probable anteroseptal infarct, old No old tracing to compare Confirmed by Lacretia Leigh ([id number]) on 09/13/2019 3:03:48 PM    Patient here with hyperglycemia and fever.  She is Covid positive.  She has evidence of UTI.  Code sepsis called given IV antibiotics.  No fluids given because she is Covid positive.  Will admit to the hospitalist service   Lacretia Leigh, MD 09/13/19 1511

## 2019-09-13 NOTE — Progress Notes (Signed)
Pharmacy Antibiotic Note  Joy Phillips is a 48 y.o. female admitted on 09/13/2019 with abdominal pain and COVID pneumonia. Pharmacy has been consulted for vancomycin and cefepime dosing while bacterial PNA ruled out. Also adding Remdesivir  Plan:  Vancomycin 1000 mg IV now, then 750 mg IV q12 hr (est AUC 514 based on SCr 0.95, Vd 0.5)  Measure vancomycin AUC at steady state as indicated  SCr q48 hr while on vancomycin  Cefepime 2g IV q8 hr . Remdesivir 200 mg IV once followed by 100 mg IV daily x 4 days . Daily CMET while on remdesivir . Follow ALT and clinical condition . Recommend checking MRSA PCR to r/o need for vancomycin   Height: 5\' 7"  (170.2 cm) Weight: 203 lb (92.1 kg) IBW/kg (Calculated) : 61.6  Temp (24hrs), Avg:102.3 F (39.1 C), Min:102.3 F (39.1 C), Max:102.3 F (39.1 C)  Recent Labs  Lab 09/13/19 1355 09/13/19 1714  WBC 5.6  --   CREATININE 0.95 0.83  LATICACIDVEN 0.9  1.0  --     Estimated Creatinine Clearance: 97.6 mL/min (by C-G formula based on SCr of 0.83 mg/dL).    No Known Allergies  Antimicrobials this admission: 11/29 vancomycin >>  11/29 cefepime >>  11/29 remdesivir >> (12/3)  Dose adjustments this admission: n/a  Microbiology results: 11/29 BCx: sent 11/29 UCx: sent    Thank you for allowing pharmacy to be a part of this patient's care.  Reuel Boom, PharmD, BCPS [phone] 09/13/2019, 7:52 PM

## 2019-09-13 NOTE — H&P (Signed)
History and Physical    Merav Burchill  [id number]  DOB: 1971-05-14  DOA: 09/13/2019 PCP: Gwendalyn Ege, MD   Patient coming from: home  Chief Complaint: abdominal pain  HPI: Joy Phillips is a 48 y.o. female with medical history of diabetes who presents to the hospital today with a complaint of abdominal pain for the past 2 to 3 days.  She also notes that her blood sugars have been high.  She has felt that she has developed a mild cough and mild dyspnea.  The cough is nonproductive.  Her abdominal pain is more in her upper abdomen and currently is mild.  It has been intermittent in nature and associated with some nausea.  She has not had any vomiting.  She had 2 episodes of loose stools yesterday but has not had any bowel movements today.  No blood noted in her stool.  The patient notes that her sister whom she lives with was admitted to [facility] several days ago with pneumonia but apparently she was Covid negative.  ED Course: Noted to be Covid positive.  Temperature 102.3, respiratory rate in the 20s, heart rate in the low 100s  CTA chest CT abdomen and pelvis with contrast performed in ED: Bilateral patchy areas of ground-glass and consolidation most compatible with history of COVID-19. 3. Few fluid-filled loops of bowel within the left hemiabdomen are nonspecific however may be secondary to ileus. 4. The right ovary is enlarged and contains multiple cystic structures. This needs further evaluation pelvic ultrasound.  Review of Systems:  All other systems reviewed and apart from HPI, are negative.  Past Medical History:  Diagnosis Date   Cancer [facility])     Past Surgical History:  Procedure Laterality Date   KIDNEY SURGERY      Social History:   reports that she has never smoked. She has never used smokeless tobacco. She reports that she does not drink alcohol or use drugs.  No Known Allergies  No family history on file.   Prior to Admission medications   Medication Sig  Start Date End Date Taking? Authorizing Provider  Albuterol Sulfate 108 (90 Base) MCG/ACT AEPB Inhale 1-2 puffs into the lungs 4 (four) times daily as needed (for SOB).   Yes [provider]  Ascorbic Acid 500 MG CAPS Take 2 tablets by mouth daily. 01/09/10  Yes [provider]  Insulin Detemir (LEVEMIR FLEXTOUCH) 100 UNIT/ML Pen Inject 10 Units into the skin at bedtime. 08/30/16  Yes [provider]    Physical Exam: Wt Readings from Last 3 Encounters:  09/13/19 92.1 kg  04/11/13 104.3 kg   Vitals:   09/13/19 1421 09/13/19 1600 09/13/19 1619 09/13/19 1830  BP: (!) 133/95 129/64  (!) 143/77  Pulse: (!) 103 89  94  Resp: (!) 24 (!) 23  (!) 27  Temp:      TempSrc:      SpO2: 96% 96%  96%  Weight:   92.1 kg   Height:   5\' 7"  (1.702 m)       Constitutional:  Calm & comfortable Eyes: PERRLA, lids and conjunctivae normal ENT:  Mucous membranes are moist.  Pharynx clear of exudate   Normal dentition.  Neck: Supple, no masses  Respiratory:  Clear to auscultation bilaterally  Normal respiratory effort.  Cardiovascular:  S1 & S2 heard, regular rate and rhythm No Murmurs Abdomen:  Non distended Mild tenderness in upper abdomen No masses Bowel sounds normal Extremities:  No clubbing / cyanosis No pedal  edema No joint deformity    Skin:  No rashes, lesions or ulcers Neurologic:  AAO x 3 CN 2-12 grossly intact Sensation intact Strength 5/5 in all 4 extremities Psychiatric:  Normal Mood and affect    Labs on Admission: I have personally reviewed following labs and imaging studies  CBC: Recent Labs  Lab 09/13/19 1355  WBC 5.6  HGB 14.5  HCT 44.1  MCV 90.6  PLT [id number]*   Basic Metabolic Panel: Recent Labs  Lab 09/13/19 1355  NA 133*  K 3.2*  CL 99  CO2 21*  GLUCOSE 261*  BUN 11  CREATININE 0.95  CALCIUM 8.4*   GFR: Estimated Creatinine Clearance: 85.3 mL/min (by C-G formula based on SCr of 0.95 mg/dL). Liver Function  Tests: Recent Labs  Lab 09/13/19 1355  AST 47*  ALT 34  ALKPHOS 53  BILITOT 0.7  PROT 7.3  ALBUMIN 3.4*   No results for input(s): LIPASE, AMYLASE in the last 168 hours. No results for input(s): AMMONIA in the last 168 hours. Coagulation Profile: Recent Labs  Lab 09/13/19 1355  INR 1.1   Cardiac Enzymes: No results for input(s): CKTOTAL, CKMB, CKMBINDEX, TROPONINI in the last 168 hours. BNP (last 3 results) No results for input(s): PROBNP in the last 8760 hours. HbA1C: No results for input(s): HGBA1C in the last 72 hours. CBG: Recent Labs  Lab 09/13/19 1223 09/13/19 1624  GLUCAP 250* 253*   Lipid Profile: No results for input(s): CHOL, HDL, LDLCALC, TRIG, CHOLHDL, LDLDIRECT in the last 72 hours. Thyroid Function Tests: No results for input(s): TSH, T4TOTAL, FREET4, T3FREE, THYROIDAB in the last 72 hours. Anemia Panel: No results for input(s): VITAMINB12, FOLATE, FERRITIN, TIBC, IRON, RETICCTPCT in the last 72 hours. Urine analysis:    Component Value Date/Time   COLORURINE AMBER (A) 09/13/2019 1355   APPEARANCEUR HAZY (A) 09/13/2019 1355   LABSPEC 1.023 09/13/2019 1355   PHURINE 6.0 09/13/2019 1355   GLUCOSEU >=500 (A) 09/13/2019 1355   HGBUR NEGATIVE 09/13/2019 [place] 09/13/2019 1355   KETONESUR 80 (A) 09/13/2019 1355   PROTEINUR 100 (A) 09/13/2019 1355   NITRITE NEGATIVE 09/13/2019 1355   LEUKOCYTESUR MODERATE (A) 09/13/2019 1355   Sepsis Labs: @LABRCNTIP (procalcitonin:4,lacticidven:4) ) Recent Results (from the past 240 hour(s))  Blood Culture (routine x 2)     Status: None (Preliminary result)   Collection Time: 09/13/19  1:55 PM   Specimen: BLOOD  Result Value Ref Range Status   Specimen Description   Final    BLOOD RIGHT ANTECUBITAL Performed at [facility], [place] [address] [place], [place] [address]    Special Requests   Final    BOTTLES DRAWN AEROBIC AND ANAEROBIC Blood Culture adequate volume Performed at [place] [address] [place], [place] [address]    Culture PENDING  Incomplete   Report Status PENDING  Incomplete  Blood Culture (routine x 2)     Status: None (Preliminary result)   Collection Time: 09/13/19  1:55 PM   Specimen: BLOOD  Result Value Ref Range Status   Specimen Description   Final    BLOOD LEFT ANTECUBITAL Performed at [facility], [place] [address] [place], [place] [address]    Special Requests   Final    BOTTLES DRAWN AEROBIC AND ANAEROBIC Blood Culture adequate volume Performed at [place] [address] [place], [place] [address]    Culture PENDING  Incomplete   Report Status PENDING  Incomplete     Radiological Exams on Admission: Ct Angio Chest Pe W And/or Wo Contrast  Result Date: 09/13/2019 CLINICAL DATA:  Patient with history of COVID-19. Abdominal pain. Evaluate for pulmonary embolus. EXAM: CT CHEST, ABDOMEN, AND PELVIS WITH CONTRAST TECHNIQUE: Multidetector CT imaging of the chest, abdomen and pelvis was performed following the standard protocol during bolus administration of intravenous contrast. CONTRAST:  154mL OMNIPAQUE IOHEXOL 350 MG/ML SOLN COMPARISON:  CT abdomen pelvis 12/15/2009. FINDINGS: CT CHEST FINDINGS Cardiovascular: Normal heart size. Aorta main pulmonary artery normal in caliber. Adequate opacification of the pulmonary arterial system. Motion artifact limits evaluation. Within the above limitation, no intraluminal filling defect identified to suggest acute pulmonary embolus. Mediastinum/Nodes: No enlarged axillary, mediastinal or hilar lymphadenopathy. Normal appearance of the esophagus. Lungs/Pleura: Central airways are patent. Sharply marginated patchy areas of ground-glass attenuation are demonstrated within the lungs bilaterally. No pleural effusion or pneumothorax. Musculoskeletal: No aggressive or acute appearing osseous lesions. CT ABDOMEN PELVIS FINDINGS Hepatobiliary: The liver is normal  in size and contour. Gallbladder is unremarkable. No intrahepatic or extrahepatic biliary ductal dilatation. Pancreas: Unremarkable Spleen: Unremarkable Adrenals/Urinary Tract: Normal adrenal glands. Kidneys enhance symmetrically with contrast. Postsurgical changes involving the midpole of the right kidney. Subcentimeter too small to characterize low-attenuation lesion superior pole right kidney. Urinary bladder is unremarkable. Stomach/Bowel: There are a few fluid-filled loops of small bowel (2 cm) within the left hemiabdomen (image 45; series 3). No abnormal bowel wall thickening. No free fluid or free intraperitoneal air. Normal morphology of the stomach. Vascular/Lymphatic: Normal caliber abdominal aorta. No retroperitoneal lymphadenopathy. Reproductive: Status post hysterectomy. The right ovary is enlarged measuring up to 5.7 x 3.8 cm and contains multiple cystic structures. Prominent follicle within the left ovary. Other: None. Musculoskeletal: Lumbar spine degenerative changes. No aggressive or acute appearing osseous lesions. IMPRESSION: 1. No evidence for acute pulmonary embolus. 2. Bilateral patchy areas of ground-glass and consolidation most compatible with history of COVID-19. 3. Few fluid-filled loops of bowel within the left hemiabdomen are nonspecific however may be secondary to ileus. 4. The right ovary is enlarged and contains multiple cystic structures. This needs further evaluation pelvic ultrasound. Electronically Signed   By: Lovey Newcomer M.D.   On: 09/13/2019 16:43   Ct Abdomen Pelvis W Contrast  Result Date: 09/13/2019 CLINICAL DATA:  Patient with history of COVID-19. Abdominal pain. Evaluate for pulmonary embolus. EXAM: CT CHEST, ABDOMEN, AND PELVIS WITH CONTRAST TECHNIQUE: Multidetector CT imaging of the chest, abdomen and pelvis was performed following the standard protocol during bolus administration of intravenous contrast. CONTRAST:  124mL OMNIPAQUE IOHEXOL 350 MG/ML SOLN COMPARISON:   CT abdomen pelvis 12/15/2009. FINDINGS: CT CHEST FINDINGS Cardiovascular: Normal heart size. Aorta main pulmonary artery normal in caliber. Adequate opacification of the pulmonary arterial system. Motion artifact limits evaluation. Within the above limitation, no intraluminal filling defect identified to suggest acute pulmonary embolus. Mediastinum/Nodes: No enlarged axillary, mediastinal or hilar lymphadenopathy. Normal appearance of the esophagus. Lungs/Pleura: Central airways are patent. Sharply marginated patchy areas of ground-glass attenuation are demonstrated within the lungs bilaterally. No pleural effusion or pneumothorax. Musculoskeletal: No aggressive or acute appearing osseous lesions. CT ABDOMEN PELVIS FINDINGS Hepatobiliary: The liver is normal in size and contour. Gallbladder is unremarkable. No intrahepatic or extrahepatic biliary ductal dilatation. Pancreas: Unremarkable Spleen: Unremarkable Adrenals/Urinary Tract: Normal adrenal glands. Kidneys enhance symmetrically with contrast. Postsurgical changes involving the midpole of the right kidney. Subcentimeter too small to characterize low-attenuation lesion superior pole right kidney. Urinary bladder is unremarkable. Stomach/Bowel: There are  a few fluid-filled loops of small bowel (2 cm) within the left hemiabdomen (image 45; series 3). No abnormal bowel wall thickening. No free fluid or free intraperitoneal air. Normal morphology of the stomach. Vascular/Lymphatic: Normal caliber abdominal aorta. No retroperitoneal lymphadenopathy. Reproductive: Status post hysterectomy. The right ovary is enlarged measuring up to 5.7 x 3.8 cm and contains multiple cystic structures. Prominent follicle within the left ovary. Other: None. Musculoskeletal: Lumbar spine degenerative changes. No aggressive or acute appearing osseous lesions. IMPRESSION: 1. No evidence for acute pulmonary embolus. 2. Bilateral patchy areas of ground-glass and consolidation most  compatible with history of COVID-19. 3. Few fluid-filled loops of bowel within the left hemiabdomen are nonspecific however may be secondary to ileus. 4. The right ovary is enlarged and contains multiple cystic structures. This needs further evaluation pelvic ultrasound. Electronically Signed   By: Lovey Newcomer M.D.   On: 09/13/2019 16:43   Dg Chest Port 1 View  Result Date: 09/13/2019 CLINICAL DATA:  Abdominal pain. Sepsis. EXAM: PORTABLE CHEST 1 VIEW COMPARISON:  None. FINDINGS: Bilateral pulmonary infiltrates are identified. No pneumothorax. The heart, hila, mediastinum, lungs, and pleura are otherwise normal. IMPRESSION: Bilateral pulmonary infiltrates are identified. Atypical infections should be considered. Electronically Signed   By: Dorise Bullion III M.D   On: 09/13/2019 13:32    EKG: Independently reviewed. Sinus rhythm T wave inversions in lead 3 and AVr  Assessment/Plan Principal Problem:   Pneumonia due to COVID-19 virus with sepsis - admit to [facility] - the patient has bilateral pulmonary infiltrates, fever, mild cough and dyspnea but is not hypoxic - ER has started IV antibiotics which I will continue for now- f/u on Pro calcitonin and then decide to d/c if normal - check Influenza and Resp panel - I have started IV steroids and Remdesivir - f/u on COVID labs   Active Problems:    Abdominal pain - possibly COVID related as well- CT suggestive of possible ileus but abdomen is not distended - bowel sounds are present - will start clear liquids and follow abdominal symptoms  Hypokalemia - replace orally and via IV- check Mg level  Elevated Troponin - possibly due to infection as well- she states she has not had any pain in her chest (however, she is a diabetic) - EKG is slightly abnormal with inverted T waves in lead 3 and AVr- follow Tropoinin- will not start Heparin at this point    DM (diabetes mellitus), type 2  - sugars now in 200s - will continue her home Levimir dose  - although she will be only on clear liquids, she is being started on Steroids and thus sugars are more likely to be elevated  - start Novolog SSI - check A1c  ? UTI - she has moderate leukocytosis and rare bacteria - she notes symptoms of hesitancy with urination but no dysuria - f/u urine culture     Ovarian cyst - note incidentally on imaging- have ordered f/u with a non urgent pelvic ultrasound      DVT prophylaxis: Lovenox Code Status: Full code  Family Communication:   Disposition Plan: transfer to [facility] called: none  Admission status: inpatient    Debbe Odea MD [facility] Pager: [email or web address] Password [email or web address] 7PM-7AM, please contact night-coverage   09/13/2019, 6:59 PM

## 2019-09-13 NOTE — ED Notes (Signed)
[facility] called. This RN attempted to called and give repot to [facility] but nurse on break will follow up

## 2019-09-13 NOTE — ED Notes (Signed)
Blood cultures drawn before abx started. ?

## 2019-09-13 NOTE — ED Notes (Signed)
This RN messaged pharmacy to verify and send medications

## 2019-09-13 NOTE — ED Notes (Signed)
Sanah Burnside (sister) [phone] please call with update

## 2019-09-13 NOTE — ED Triage Notes (Signed)
Patient reports to the ED with complaint of hyperglycemia and fever. Patient reports she started feeling bad about 3 days ago but the pain in her abdomen got worse last night and she needed to come in today. Patient reports her sugars at home have been around 500.

## 2019-09-13 NOTE — ED Notes (Addendum)
Patient is aware that urine sample is needed but patient does not have to void at this time.

## 2019-09-13 NOTE — ED Notes (Signed)
Pt transported to CT ?

## 2019-09-14 ENCOUNTER — Inpatient Hospital Stay (HOSPITAL_COMMUNITY): Payer: BC Managed Care – PPO

## 2019-09-14 ENCOUNTER — Encounter (HOSPITAL_COMMUNITY): Payer: Self-pay

## 2019-09-14 LAB — COMPREHENSIVE METABOLIC PANEL
ALT: 34 U/L (ref 0–44)
AST: 45 U/L — ABNORMAL HIGH (ref 15–41)
Albumin: 3.1 g/dL — ABNORMAL LOW (ref 3.5–5.0)
Alkaline Phosphatase: 49 U/L (ref 38–126)
Anion gap: 11 (ref 5–15)
BUN: 12 mg/dL (ref 6–20)
CO2: 20 mmol/L — ABNORMAL LOW (ref 22–32)
Calcium: 8 mg/dL — ABNORMAL LOW (ref 8.9–10.3)
Chloride: 106 mmol/L (ref 98–111)
Creatinine, Ser: 0.75 mg/dL (ref 0.44–1.00)
GFR calc Af Amer: >60 mL/min (ref >60)
GFR calc non Af Amer: >60 mL/min (ref >60)
Glucose, Bld: 339 mg/dL — ABNORMAL HIGH (ref 70–99)
Potassium: 4.1 mmol/L (ref 3.5–5.1)
Sodium: 137 mmol/L (ref 135–145)
Total Bilirubin: 0.6 mg/dL (ref 0.3–1.2)
Total Protein: 6.9 g/dL (ref 6.5–8.1)

## 2019-09-14 LAB — CBC WITH DIFFERENTIAL/PLATELET
Abs Immature Granulocytes: 0.03 10*3/uL (ref 0.00–0.07)
Basophils Absolute: 0 10*3/uL (ref 0.0–0.1)
Basophils Relative: 1 %
Eosinophils Absolute: 0 10*3/uL (ref 0.0–0.5)
Eosinophils Relative: 0 %
HCT: 43.7 % (ref 36.0–46.0)
Hemoglobin: 14.3 g/dL (ref 12.0–15.0)
Immature Granulocytes: 1 %
Lymphocytes Relative: 15 %
Lymphs Abs: 0.6 10*3/uL — ABNORMAL LOW (ref 0.7–4.0)
MCH: 29.4 pg (ref 26.0–34.0)
MCHC: 32.7 g/dL (ref 30.0–36.0)
MCV: 89.9 fL (ref 80.0–100.0)
Monocytes Absolute: 0.2 10*3/uL (ref 0.1–1.0)
Monocytes Relative: 6 %
Neutro Abs: 3.2 10*3/uL (ref 1.7–7.7)
Neutrophils Relative %: 77 %
Platelets: 131 10*3/uL — ABNORMAL LOW (ref 150–400)
RBC: 4.86 MIL/uL (ref 3.87–5.11)
RDW: 12.6 % (ref 11.5–15.5)
WBC: 4.1 10*3/uL (ref 4.0–10.5)
nRBC: 0 % (ref 0.0–0.2)

## 2019-09-14 LAB — RESPIRATORY PANEL BY PCR

## 2019-09-14 LAB — URINE CULTURE

## 2019-09-14 LAB — HEMOGLOBIN A1C
Hgb A1c MFr Bld: 14.3 % — ABNORMAL HIGH (ref 4.8–5.6)
Mean Plasma Glucose: 363.71 mg/dL

## 2019-09-14 LAB — GLUCOSE, CAPILLARY
Glucose-Capillary: 322 mg/dL — ABNORMAL HIGH (ref 70–99)
Glucose-Capillary: 269 mg/dL — ABNORMAL HIGH (ref 70–99)
Glucose-Capillary: 242 mg/dL — ABNORMAL HIGH (ref 70–99)
Glucose-Capillary: 225 mg/dL — ABNORMAL HIGH (ref 70–99)

## 2019-09-14 LAB — PHOSPHORUS: Phosphorus: 3.4 mg/dL (ref 2.5–4.6)

## 2019-09-14 LAB — MAGNESIUM: Magnesium: 1.9 mg/dL (ref 1.7–2.4)

## 2019-09-14 LAB — SAMPLE TO BLOOD BANK

## 2019-09-14 LAB — D-DIMER, QUANTITATIVE: D-Dimer, Quant: 0.77 ug/mL-FEU — ABNORMAL HIGH (ref 0.00–0.50)

## 2019-09-14 LAB — C-REACTIVE PROTEIN: CRP: 6.1 mg/dL — ABNORMAL HIGH (ref <1.0)

## 2019-09-14 LAB — FERRITIN: Ferritin: 659 ng/mL — ABNORMAL HIGH (ref 11–307)

## 2019-09-14 MED ORDER — ALBUTEROL SULFATE HFA 108 (90 BASE) MCG/ACT IN AERS
2.0000 | INHALATION_SPRAY | RESPIRATORY_TRACT | Status: DC | PRN
  Filled 2019-09-14: qty 6.7

## 2019-09-14 MED ORDER — INSULIN ASPART 100 UNIT/ML ~~LOC~~ SOLN
4.0000 [IU] | Freq: Three times a day (TID) | SUBCUTANEOUS | Status: DC
  Administered 2019-09-14 – 2019-09-17 (×9): 4 [IU] via SUBCUTANEOUS

## 2019-09-14 MED ORDER — ACETAMINOPHEN 325 MG PO TABS: 650.0000 mg | ORAL_TABLET | Freq: Four times a day (QID) | ORAL | Status: DC | PRN

## 2019-09-14 MED ORDER — BENZONATATE 100 MG PO CAPS
200.0000 mg | ORAL_CAPSULE | Freq: Three times a day (TID) | ORAL | Status: DC | PRN
  Administered 2019-09-14: 200 mg via ORAL
  Filled 2019-09-14: qty 2

## 2019-09-14 MED ORDER — LOPERAMIDE HCL 2 MG PO CAPS
2.0000 mg | ORAL_CAPSULE | ORAL | Status: DC | PRN
  Administered 2019-09-14: 2 mg via ORAL
  Filled 2019-09-14 (×2): qty 1

## 2019-09-14 MED ORDER — INSULIN ASPART 100 UNIT/ML ~~LOC~~ SOLN
0.0000 [IU] | Freq: Three times a day (TID) | SUBCUTANEOUS | Status: DC
  Administered 2019-09-14: 11 [IU] via SUBCUTANEOUS
  Administered 2019-09-14 – 2019-09-16 (×5): 7 [IU] via SUBCUTANEOUS
  Administered 2019-09-16 – 2019-09-17 (×4): 4 [IU] via SUBCUTANEOUS

## 2019-09-14 MED ORDER — INSULIN DETEMIR 100 UNIT/ML ~~LOC~~ SOLN
15.0000 [IU] | Freq: Two times a day (BID) | SUBCUTANEOUS | Status: DC
  Administered 2019-09-14 – 2019-09-16 (×6): 15 [IU] via SUBCUTANEOUS
  Filled 2019-09-14 (×7): qty 0.15

## 2019-09-14 MED ORDER — INSULIN ASPART 100 UNIT/ML ~~LOC~~ SOLN
0.0000 [IU] | Freq: Every day | SUBCUTANEOUS | Status: DC
  Administered 2019-09-14: 2 [IU] via SUBCUTANEOUS
  Administered 2019-09-16: 3 [IU] via SUBCUTANEOUS

## 2019-09-14 NOTE — Progress Notes (Signed)
PROGRESS NOTE                                                                                                                                                                                                             Patient Demographics:    Joy Phillips, is a 48 y.o. female, DOB - 1970-11-20, [id number]  Outpatient Primary MD for the patient is Gwendalyn Ege, MD   Admit date - 09/13/2019   LOS - 1  Chief Complaint  Patient presents with   Hyperglycemia   Fever       Brief Narrative: Patient is a 48 y.o. female with PMHx of DM-2 presented with a 2-3-day history of diarrhea, cough and dyspnea, she was found to have COVID-19 pneumonia and admitted to the hospitalist service.  See below for further details.   Subjective:    Joy Phillips today feels slightly better-continues to cough-diarrhea is slowing down.   Assessment  & Plan :   Sepsis secondary to COVID-19: Improved-sepsis physiology is improving as well.  Continue steroids and remdesivir.  Fever: afebrile/  O2 requirements: On RA SpO2: 93 %   COVID-19 Labs: Recent Labs    09/13/19 1355 09/13/19 1714 09/14/19 0257  DDIMER 1.42*  --  0.77*  FERRITIN  --  760* 659*  LDH  --  365*  --   CRP  --  5.2* 6.1*    No results found for: SARSCOV2NAA   COVID-19 Medications: Steroids: 11/29>> Remdesivir: 11/29>> Actemra: Not indicated-given no hypoxia Convalescent Plasma: Hold off for now  Other medications: Diuretics:Euvolemic-no need for lasix Antibiotics:Not needed as no evidence of bacterial infection-stop cefepime/Vanco Vancomycin/cefepime: 11/29>> 11/30  Prone/Incentive Spirometry: encouraged incentive spirometry use 3-4/hour.  DVT Prophylaxis  :  Lovenox   Gastroenteritis secondary to COVID-19: Has diarrhea but slowly improving-supportive care-use Imodium as needed.  Uncontrolled DM-2 with hyperglycemia (A1c 14.3 on 11/29): CBGs  uncontrolled as patient on steroids-but appears to have poor long-term control as well.  Change Levemir to 15 units twice daily, add 4 units of NovoLog with meals-change SSI to resistant scale.  Follow and adjust.  Right ovarian cyst: Patient with a pelvic ultrasound-but suspect this can be performed in the outpatient setting  Asymptomatic bacteriuria: Does not have any symptoms of UTI-no need for antibiotics.  Will follow cultures.  Obesity: Estimated body mass index is  32.51 kg/m as calculated from the following:   Height as of this encounter: 5\' 7"  (1.702 m).   Weight as of this encounter: 94.2 kg.    Consults  :  None  Procedures  :  None  ABG: No results found for: PHART, PCO2ART, PO2ART, HCO3, TCO2, ACIDBASEDEF, O2SAT  Vent Settings: N/A  Condition - Stable  Family Communication  :  Brother  updated over the phone  Code Status :  Full Code  Diet :  Diet Order            Diet clear liquid Room service appropriate? Yes; Fluid consistency: Thin  Diet effective now               Disposition Plan  :  Remain hospitalized  Barriers to discharge: complete 5 days of IV Remdesivir  Antimicorbials  :    Anti-infectives (From admission, onward)   Start     Dose/Rate Route Frequency Ordered Stop   09/14/19 2200  remdesivir 100 mg in sodium chloride 0.9 % 250 mL IVPB     100 mg 500 mL/hr over 30 Minutes Intravenous Every 24 hours 09/13/19 1954 09/18/19 2159   09/13/19 2200  vancomycin (VANCOCIN) IVPB 750 mg/150 ml premix  Status:  Discontinued     750 mg 150 mL/hr over 60 Minutes Intravenous Every 12 hours 09/13/19 1920 09/14/19 0929   09/13/19 2200  ceFEPIme (MAXIPIME) 2 g in sodium chloride 0.9 % 100 mL IVPB  Status:  Discontinued     2 g 200 mL/hr over 30 Minutes Intravenous Every 8 hours 09/13/19 1920 09/14/19 0929   09/13/19 2000  remdesivir 200 mg in sodium chloride 0.9 % 250 mL IVPB     200 mg 500 mL/hr over 30 Minutes Intravenous Once 09/13/19 1954 09/13/19  2116   09/13/19 1245  ceFEPIme (MAXIPIME) 2 g in sodium chloride 0.9 % 100 mL IVPB     2 g 200 mL/hr over 30 Minutes Intravenous  Once 09/13/19 1240 09/13/19 1350   09/13/19 1245  metroNIDAZOLE (FLAGYL) IVPB 500 mg     500 mg 100 mL/hr over 60 Minutes Intravenous  Once 09/13/19 1240 09/13/19 1619   09/13/19 1245  vancomycin (VANCOCIN) IVPB 1000 mg/200 mL premix     1,000 mg 200 mL/hr over 60 Minutes Intravenous  Once 09/13/19 1240 09/13/19 1721      Inpatient Medications  Scheduled Meds:  enoxaparin (LOVENOX) injection  40 mg Subcutaneous Q24H   insulin aspart  0-20 Units Subcutaneous TID WC   insulin aspart  0-5 Units Subcutaneous QHS   insulin aspart  4 Units Subcutaneous TID WC   insulin detemir  15 Units Subcutaneous BID   methylPREDNISolone (SOLU-MEDROL) injection  60 mg Intravenous Q12H   potassium chloride  40 mEq Oral Once   sodium chloride flush  3 mL Intravenous Q12H   Continuous Infusions:  sodium chloride     0.9 % NaCl with KCl 40 mEq / L 125 mL/hr (09/14/19 0520)   remdesivir 100 mg in NS 250 mL     PRN Meds:.sodium chloride, ondansetron **OR** ondansetron (ZOFRAN) IV, sodium chloride flush   Time Spent in minutes  25  See all Orders from today for further details   Oren Binet M.D on 09/14/2019 at 11:30 AM  To page go to [email or web address] - use universal password  [facility] -  Office  [phone]    Objective:   Vitals:   09/13/19 [id number] 09/13/19 2356 09/14/19 [id number] 09/14/19 [id number]  BP: 125/77  115/71 131/77  Pulse: 89  88 88  Resp: (!) 24  (!) 22 (!) 22  Temp: 97.9 F (36.6 C)  98.4 F (36.9 C) 98.1 F (36.7 C)  TempSrc: Oral  Oral Oral  SpO2: 95%  94% 93%  Weight:  94.2 kg    Height: 5\' 7"  (1.702 m) 5\' 7"  (1.702 m)      Wt Readings from Last 3 Encounters:  09/13/19 94.2 kg  04/11/13 104.3 kg     Intake/Output Summary (Last 24 hours) at 09/14/2019 1130 Last data filed at 09/14/2019 0803 Gross per 24 hour  Intake  2816.26 ml  Output --  Net 2816.26 ml     Physical Exam Gen Exam:Alert awake-not in any distress HEENT:atraumatic, normocephalic Chest: B/L clear to auscultation anteriorly CVS:S1S2 regular Abdomen:soft non tender, non distended Extremities:no edema Neurology: Non focal Skin: no rash   Data Review:    CBC Recent Labs  Lab 09/13/19 1355 09/13/19 2029 09/14/19 0257  WBC 5.6 5.5 4.1  HGB 14.5 13.1 14.3  HCT 44.1 40.4 43.7  PLT 124* 115* 131*  MCV 90.6 89.6 89.9  MCH 29.8 29.0 29.4  MCHC 32.9 32.4 32.7  RDW 12.3 12.4 12.6  LYMPHSABS  --   --  0.6*  MONOABS  --   --  0.2  EOSABS  --   --  0.0  BASOSABS  --   --  0.0    Chemistries  Recent Labs  Lab 09/13/19 1355 09/13/19 1714 09/14/19 0257  NA 133*  --  137  K 3.2*  --  4.1  CL 99  --  106  CO2 21*  --  20*  GLUCOSE 261*  --  339*  BUN 11  --  12  CREATININE 0.95 0.83 0.75  CALCIUM 8.4*  --  8.0*  MG  --  1.7 1.9  AST 47* 42* 45*  ALT 34 26 34  ALKPHOS 53 41 49  BILITOT 0.7 2.0* 0.6   ------------------------------------------------------------------------------------------------------------------ No results for input(s): CHOL, HDL, LDLCALC, TRIG, CHOLHDL, LDLDIRECT in the last 72 hours.  Lab Results  Component Value Date   HGBA1C 14.3 (H) 09/13/2019   ------------------------------------------------------------------------------------------------------------------ No results for input(s): TSH, T4TOTAL, T3FREE, THYROIDAB in the last 72 hours.  Invalid input(s): FREET3 ------------------------------------------------------------------------------------------------------------------ Recent Labs    09/13/19 1714 09/14/19 0257  FERRITIN 760* 659*    Coagulation profile Recent Labs  Lab 09/13/19 1355  INR 1.1    Recent Labs    09/13/19 1355 09/14/19 0257  DDIMER 1.42* 0.77*    Cardiac Enzymes No results for input(s): CKMB, TROPONINI, MYOGLOBIN in the last 168 hours.  Invalid  input(s): CK ------------------------------------------------------------------------------------------------------------------ No results found for: BNP  Micro Results Recent Results (from the past 240 hour(s))  Blood Culture (routine x 2)     Status: None (Preliminary result)   Collection Time: 09/13/19  1:55 PM   Specimen: BLOOD  Result Value Ref Range Status   Specimen Description   Final    BLOOD RIGHT ANTECUBITAL Performed at [facility], [place] [address] [place], [place] [address]    Special Requests   Final    BOTTLES DRAWN AEROBIC AND ANAEROBIC Blood Culture adequate volume Performed at [facility] [address] [place], [place] [address]    Culture   Final    NO GROWTH < 24 HOURS Performed at [facility] [address] [place], [place] [address]    Report Status PENDING  Incomplete  Blood Culture (routine x 2)     Status: None (Preliminary result)   Collection Time: 09/13/19  1:55 PM   Specimen: BLOOD  Result Value Ref Range Status   Specimen Description   Final    BLOOD LEFT ANTECUBITAL Performed at [facility], [place] [address], [place], [place] [address]    Special Requests   Final    BOTTLES DRAWN AEROBIC AND ANAEROBIC Blood Culture adequate volume Performed at [facility] [address], [place], [place] [address]    Culture   Final    NO GROWTH < 24 HOURS Performed at [facility] [address], [place], [place] [address]    Report Status PENDING  Incomplete  Respiratory Panel by PCR     Status: None   Collection Time: 09/13/19  5:15 PM   Specimen: Respiratory  Result Value Ref Range Status   Adenovirus NOT DETECTED NOT DETECTED Final   Coronavirus 229E NOT DETECTED NOT DETECTED Final    Comment: (NOTE) The Coronavirus on the Respiratory Panel, DOES NOT test for the novel  Coronavirus (2019 nCoV)    Coronavirus HKU1 NOT DETECTED NOT DETECTED Final   Coronavirus NL63 NOT DETECTED  NOT DETECTED Final   Coronavirus OC43 NOT DETECTED NOT DETECTED Final   Metapneumovirus NOT DETECTED NOT DETECTED Final   Rhinovirus / Enterovirus NOT DETECTED NOT DETECTED Final   Influenza A NOT DETECTED NOT DETECTED Final   Influenza B NOT DETECTED NOT DETECTED Final   Parainfluenza Virus 1 NOT DETECTED NOT DETECTED Final   Parainfluenza Virus 2 NOT DETECTED NOT DETECTED Final   Parainfluenza Virus 3 NOT DETECTED NOT DETECTED Final   Parainfluenza Virus 4 NOT DETECTED NOT DETECTED Final   Respiratory Syncytial Virus NOT DETECTED NOT DETECTED Final   Bordetella pertussis NOT DETECTED NOT DETECTED Final   Chlamydophila pneumoniae NOT DETECTED NOT DETECTED Final   Mycoplasma pneumoniae NOT DETECTED NOT DETECTED Final    Comment: Performed at [facility], [place]. [address] [place], [place] [address]    Radiology Reports Ct Angio Chest Pe W And/or Wo Contrast  Result Date: 09/13/2019 CLINICAL DATA:  Patient with history of COVID-19. Abdominal pain. Evaluate for pulmonary embolus. EXAM: CT CHEST, ABDOMEN, AND PELVIS WITH CONTRAST TECHNIQUE: Multidetector CT imaging of the chest, abdomen and pelvis was performed following the standard protocol during bolus administration of intravenous contrast. CONTRAST:  174mL OMNIPAQUE IOHEXOL 350 MG/ML SOLN COMPARISON:  CT abdomen pelvis 12/15/2009. FINDINGS: CT CHEST FINDINGS Cardiovascular: Normal heart size. Aorta main pulmonary artery normal in caliber. Adequate opacification of the pulmonary arterial system. Motion artifact limits evaluation. Within the above limitation, no intraluminal filling defect identified to suggest acute pulmonary embolus. Mediastinum/Nodes: No enlarged axillary, mediastinal or hilar lymphadenopathy. Normal appearance of the esophagus. Lungs/Pleura: Central airways are patent. Sharply marginated patchy areas of ground-glass attenuation are demonstrated within the lungs bilaterally. No pleural effusion or pneumothorax.  Musculoskeletal: No aggressive or acute appearing osseous lesions. CT ABDOMEN PELVIS FINDINGS Hepatobiliary: The liver is normal in size and contour. Gallbladder is unremarkable. No intrahepatic or extrahepatic biliary ductal dilatation. Pancreas: Unremarkable Spleen: Unremarkable Adrenals/Urinary Tract: Normal adrenal glands. Kidneys enhance symmetrically with contrast. Postsurgical changes involving the midpole of the right kidney. Subcentimeter too small to characterize low-attenuation lesion superior pole right kidney. Urinary bladder is unremarkable. Stomach/Bowel: There are a few fluid-filled loops of small bowel (2 cm) within the left hemiabdomen (image 45; series 3). No abnormal bowel wall thickening.  No free fluid or free intraperitoneal air. Normal morphology of the stomach. Vascular/Lymphatic: Normal caliber abdominal aorta. No retroperitoneal lymphadenopathy. Reproductive: Status post hysterectomy. The right ovary is enlarged measuring up to 5.7 x 3.8 cm and contains multiple cystic structures. Prominent follicle within the left ovary. Other: None. Musculoskeletal: Lumbar spine degenerative changes. No aggressive or acute appearing osseous lesions. IMPRESSION: 1. No evidence for acute pulmonary embolus. 2. Bilateral patchy areas of ground-glass and consolidation most compatible with history of COVID-19. 3. Few fluid-filled loops of bowel within the left hemiabdomen are nonspecific however may be secondary to ileus. 4. The right ovary is enlarged and contains multiple cystic structures. This needs further evaluation pelvic ultrasound. Electronically Signed   By: Lovey Newcomer M.D.   On: 09/13/2019 16:43   Ct Abdomen Pelvis W Contrast  Result Date: 09/13/2019 CLINICAL DATA:  Patient with history of COVID-19. Abdominal pain. Evaluate for pulmonary embolus. EXAM: CT CHEST, ABDOMEN, AND PELVIS WITH CONTRAST TECHNIQUE: Multidetector CT imaging of the chest, abdomen and pelvis was performed following the  standard protocol during bolus administration of intravenous contrast. CONTRAST:  156mL OMNIPAQUE IOHEXOL 350 MG/ML SOLN COMPARISON:  CT abdomen pelvis 12/15/2009. FINDINGS: CT CHEST FINDINGS Cardiovascular: Normal heart size. Aorta main pulmonary artery normal in caliber. Adequate opacification of the pulmonary arterial system. Motion artifact limits evaluation. Within the above limitation, no intraluminal filling defect identified to suggest acute pulmonary embolus. Mediastinum/Nodes: No enlarged axillary, mediastinal or hilar lymphadenopathy. Normal appearance of the esophagus. Lungs/Pleura: Central airways are patent. Sharply marginated patchy areas of ground-glass attenuation are demonstrated within the lungs bilaterally. No pleural effusion or pneumothorax. Musculoskeletal: No aggressive or acute appearing osseous lesions. CT ABDOMEN PELVIS FINDINGS Hepatobiliary: The liver is normal in size and contour. Gallbladder is unremarkable. No intrahepatic or extrahepatic biliary ductal dilatation. Pancreas: Unremarkable Spleen: Unremarkable Adrenals/Urinary Tract: Normal adrenal glands. Kidneys enhance symmetrically with contrast. Postsurgical changes involving the midpole of the right kidney. Subcentimeter too small to characterize low-attenuation lesion superior pole right kidney. Urinary bladder is unremarkable. Stomach/Bowel: There are a few fluid-filled loops of small bowel (2 cm) within the left hemiabdomen (image 45; series 3). No abnormal bowel wall thickening. No free fluid or free intraperitoneal air. Normal morphology of the stomach. Vascular/Lymphatic: Normal caliber abdominal aorta. No retroperitoneal lymphadenopathy. Reproductive: Status post hysterectomy. The right ovary is enlarged measuring up to 5.7 x 3.8 cm and contains multiple cystic structures. Prominent follicle within the left ovary. Other: None. Musculoskeletal: Lumbar spine degenerative changes. No aggressive or acute appearing osseous  lesions. IMPRESSION: 1. No evidence for acute pulmonary embolus. 2. Bilateral patchy areas of ground-glass and consolidation most compatible with history of COVID-19. 3. Few fluid-filled loops of bowel within the left hemiabdomen are nonspecific however may be secondary to ileus. 4. The right ovary is enlarged and contains multiple cystic structures. This needs further evaluation pelvic ultrasound. Electronically Signed   By: Lovey Newcomer M.D.   On: 09/13/2019 16:43   Dg Chest Port 1 View  Result Date: 09/13/2019 CLINICAL DATA:  Abdominal pain. Sepsis. EXAM: PORTABLE CHEST 1 VIEW COMPARISON:  None. FINDINGS: Bilateral pulmonary infiltrates are identified. No pneumothorax. The heart, hila, mediastinum, lungs, and pleura are otherwise normal. IMPRESSION: Bilateral pulmonary infiltrates are identified. Atypical infections should be considered. Electronically Signed   By: Dorise Bullion III M.D   On: 09/13/2019 13:32

## 2019-09-14 NOTE — Plan of Care (Signed)
New admit

## 2019-09-14 NOTE — Progress Notes (Addendum)
Inpatient Diabetes Program Recommendations  AACE/ADA: New Consensus Statement on Inpatient Glycemic Control (2015)  Target Ranges:  Prepandial:   less than 140 mg/dL      Peak postprandial:   less than 180 mg/dL (1-2 hours)      Critically ill patients:  140 - 180 mg/dL   Lab Results  Component Value Date   GLUCAP 269 (H) 09/14/2019   HGBA1C 14.3 (H) 09/13/2019    Review of Glycemic Control Results for Joy Phillips, Joy Phillips (MRN [id number]) as of 09/14/2019 12:36  Ref. Range 09/13/2019 16:24 09/13/2019 21:45 09/13/2019 23:47 09/14/2019 07:44 09/14/2019 11:32  Glucose-Capillary Latest Ref Range: 70 - 99 mg/dL 253 (H) 272 (H) 347 (H) 322 (H) 269 (H)   Diabetes history: DM 2 Outpatient Diabetes medications:  Levemir 10 units q HS Current orders for Inpatient glycemic control:  Novolog resistant tid w/ meals and HS Novolog 4 units tid w/ meals Levemir 15 units bid  Solumedrol 60 mg IV q 12 hours  Inpatient Diabetes Program Recommendations:    Note A1C=14.3% indicating poor control of DM prior to admit (average CBG=363 mg/dL).  Patient will definitely need increased insulin doses at d/c and possibly the addition of oral DM agents to assist with insulin resistance.  Will attempt to call patient to discuss A1C and DM control as well.  Agree with current orders.   Thanks  Adah Perl, RN, BC-ADM Inpatient Diabetes Coordinator Pager [phone] (8a-5p)  Addendum:  Attempted to call patient. She asked for DM coordinator to call back tomorrow b/c she is not feeling well. Will follow.

## 2019-09-15 LAB — CBC WITH DIFFERENTIAL/PLATELET
Abs Immature Granulocytes: 0.06 10*3/uL (ref 0.00–0.07)
Basophils Absolute: 0 10*3/uL (ref 0.0–0.1)
Basophils Relative: 0 %
Eosinophils Absolute: 0 10*3/uL (ref 0.0–0.5)
Eosinophils Relative: 0 %
HCT: 44.4 % (ref 36.0–46.0)
Hemoglobin: 14.7 g/dL (ref 12.0–15.0)
Immature Granulocytes: 1 %
Lymphocytes Relative: 14 %
Lymphs Abs: 1.1 10*3/uL (ref 0.7–4.0)
MCH: 29.5 pg (ref 26.0–34.0)
MCHC: 33.1 g/dL (ref 30.0–36.0)
MCV: 89.2 fL (ref 80.0–100.0)
Monocytes Absolute: 0.5 10*3/uL (ref 0.1–1.0)
Monocytes Relative: 7 %
Neutro Abs: 5.6 10*3/uL (ref 1.7–7.7)
Neutrophils Relative %: 78 %
Platelets: 139 10*3/uL — ABNORMAL LOW (ref 150–400)
RBC: 4.98 MIL/uL (ref 3.87–5.11)
RDW: 12.9 % (ref 11.5–15.5)
WBC: 7.3 10*3/uL (ref 4.0–10.5)
nRBC: 0 % (ref 0.0–0.2)

## 2019-09-15 LAB — COMPREHENSIVE METABOLIC PANEL
ALT: 32 U/L (ref 0–44)
AST: 37 U/L (ref 15–41)
Albumin: 2.8 g/dL — ABNORMAL LOW (ref 3.5–5.0)
Alkaline Phosphatase: 47 U/L (ref 38–126)
Anion gap: 9 (ref 5–15)
BUN: 12 mg/dL (ref 6–20)
CO2: 21 mmol/L — ABNORMAL LOW (ref 22–32)
Calcium: 8.5 mg/dL — ABNORMAL LOW (ref 8.9–10.3)
Chloride: 109 mmol/L (ref 98–111)
Creatinine, Ser: 0.65 mg/dL (ref 0.44–1.00)
GFR calc Af Amer: >60 mL/min (ref >60)
GFR calc non Af Amer: >60 mL/min (ref >60)
Glucose, Bld: 219 mg/dL — ABNORMAL HIGH (ref 70–99)
Potassium: 4.2 mmol/L (ref 3.5–5.1)
Sodium: 139 mmol/L (ref 135–145)
Total Bilirubin: 0.7 mg/dL (ref 0.3–1.2)
Total Protein: 6.5 g/dL (ref 6.5–8.1)

## 2019-09-15 LAB — GLUCOSE, CAPILLARY
Glucose-Capillary: 229 mg/dL — ABNORMAL HIGH (ref 70–99)
Glucose-Capillary: 236 mg/dL — ABNORMAL HIGH (ref 70–99)
Glucose-Capillary: 248 mg/dL — ABNORMAL HIGH (ref 70–99)
Glucose-Capillary: 121 mg/dL — ABNORMAL HIGH (ref 70–99)

## 2019-09-15 LAB — FERRITIN: Ferritin: 626 ng/mL — ABNORMAL HIGH (ref 11–307)

## 2019-09-15 LAB — D-DIMER, QUANTITATIVE: D-Dimer, Quant: 0.91 ug/mL-FEU — ABNORMAL HIGH (ref 0.00–0.50)

## 2019-09-15 LAB — C-REACTIVE PROTEIN: CRP: 2.7 mg/dL — ABNORMAL HIGH (ref <1.0)

## 2019-09-15 LAB — MAGNESIUM: Magnesium: 1.9 mg/dL (ref 1.7–2.4)

## 2019-09-15 MED ORDER — SODIUM CHLORIDE 0.9 % IV SOLN
100.0000 mg | Freq: Every day | INTRAVENOUS | Status: AC
  Administered 2019-09-15 – 2019-09-17 (×3): 100 mg via INTRAVENOUS
  Filled 2019-09-15 (×3): qty 100

## 2019-09-15 NOTE — Progress Notes (Addendum)
PROGRESS NOTE                                                                                                                                                                                                             Patient Demographics:    Joy Phillips, is a 48 y.o. female, DOB - 1970-12-21, OE:[id number]  Outpatient Primary MD for the patient is Gwendalyn Ege, MD   Admit date - 09/13/2019   LOS - 2  Chief Complaint  Patient presents with   Hyperglycemia   Fever       Brief Narrative: Patient is a 48 y.o. female with PMHx of DM-2 presented with a 2-3-day history of diarrhea, cough and dyspnea, she was found to have COVID-19 pneumonia and admitted to the hospitalist service.  See below for further details.   Subjective:    Joy Phillips feels better-diarrhea has resolved.   Assessment  & Plan :   Sepsis secondary to COVID-19 PNA: Sepsis physiology has resolved-remains on room air-continue steroids and remdesivir.   Fever: afebrile/  O2 requirements: On RA SpO2: 91 %   COVID-19 Labs: Recent Labs    09/13/19 1355 09/13/19 1714 09/14/19 0257 09/15/19 0244  DDIMER 1.42*  --  0.77* 0.91*  FERRITIN  --  760* 659* 626*  LDH  --  365*  --   --   CRP  --  5.2* 6.1* 2.7*    No results found for: SARSCOV2NAA   COVID-19 Medications: Steroids: 11/29>> Remdesivir: 11/29>> Actemra: Not indicated-given no hypoxia Convalescent Plasma: Hold off for now  Other medications: Diuretics:Euvolemic-no need for lasix Antibiotics:Not needed as no evidence of bacterial infection-stop cefepime/Vanco Vancomycin/cefepime: 11/29>> 11/30  Prone/Incentive Spirometry: encouraged incentive spirometry use 3-4/hour.  DVT Prophylaxis  :  Lovenox   Gastroenteritis secondary to COVID-19: Diarrhea has improved-continue supportive care-remains on as needed Imodium.    Uncontrolled DM-2 with hyperglycemia (A1c 14.3 on 11/29): CBGs  better controlled-continue Levemir 15 units twice daily, 4 units of NovoLog with meals and resistant SSI.  Appears to have poor long-term control.  Right ovarian cyst: Seen incidentally on CT-awaiting reading of pelvic ultrasound.  Asymptomatic bacteriuria: Does not have any symptoms of UTI-no need for antibiotics.    Obesity: Estimated body mass index is 32.51 kg/m as calculated from the following:   Height as of this encounter: 5'  7" (1.702 m).   Weight as of this encounter: 94.2 kg.    Consults  :  None  Procedures  :  None  ABG: No results found for: PHART, PCO2ART, PO2ART, HCO3, TCO2, ACIDBASEDEF, O2SAT  Vent Settings: N/A  Condition - Stable  Family Communication  :  Brother  updated over the phone 12/1  Code Status :  Full Code  Diet :  Diet Order            Diet heart healthy/carb modified Room service appropriate? Yes; Fluid consistency: Thin  Diet effective now               Disposition Plan  :  Remain hospitalized  Barriers to discharge: complete 5 days of IV Remdesivir  Antimicorbials  :    Anti-infectives (From admission, onward)   Start     Dose/Rate Route Frequency Ordered Stop   09/15/19 1600  remdesivir 100 mg in sodium chloride 0.9 % 250 mL IVPB     100 mg 500 mL/hr over 30 Minutes Intravenous Daily 09/15/19 1320 09/18/19 0959   09/14/19 2200  remdesivir 100 mg in sodium chloride 0.9 % 250 mL IVPB  Status:  Discontinued     100 mg 500 mL/hr over 30 Minutes Intravenous Every 24 hours 09/13/19 1954 09/15/19 1320   09/13/19 2200  vancomycin (VANCOCIN) IVPB 750 mg/150 ml premix  Status:  Discontinued     750 mg 150 mL/hr over 60 Minutes Intravenous Every 12 hours 09/13/19 1920 09/14/19 0929   09/13/19 2200  ceFEPIme (MAXIPIME) 2 g in sodium chloride 0.9 % 100 mL IVPB  Status:  Discontinued     2 g 200 mL/hr over 30 Minutes Intravenous Every 8 hours 09/13/19 1920 09/14/19 0929   09/13/19 2000  remdesivir 200 mg in sodium chloride 0.9 % 250 mL  IVPB     200 mg 500 mL/hr over 30 Minutes Intravenous Once 09/13/19 1954 09/13/19 2116   09/13/19 1245  ceFEPIme (MAXIPIME) 2 g in sodium chloride 0.9 % 100 mL IVPB     2 g 200 mL/hr over 30 Minutes Intravenous  Once 09/13/19 1240 09/13/19 1350   09/13/19 1245  metroNIDAZOLE (FLAGYL) IVPB 500 mg     500 mg 100 mL/hr over 60 Minutes Intravenous  Once 09/13/19 1240 09/13/19 1619   09/13/19 1245  vancomycin (VANCOCIN) IVPB 1000 mg/200 mL premix     1,000 mg 200 mL/hr over 60 Minutes Intravenous  Once 09/13/19 1240 09/13/19 1721      Inpatient Medications  Scheduled Meds:  enoxaparin (LOVENOX) injection  40 mg Subcutaneous Q24H   insulin aspart  0-20 Units Subcutaneous TID WC   insulin aspart  0-5 Units Subcutaneous QHS   insulin aspart  4 Units Subcutaneous TID WC   insulin detemir  15 Units Subcutaneous BID   methylPREDNISolone (SOLU-MEDROL) injection  60 mg Intravenous Q12H   potassium chloride  40 mEq Oral Once   sodium chloride flush  3 mL Intravenous Q12H   Continuous Infusions:  sodium chloride     0.9 % NaCl with KCl 40 mEq / L 75 mL/hr (09/15/19 0832)   remdesivir 100 mg in NS 250 mL     PRN Meds:.sodium chloride, acetaminophen, albuterol, benzonatate, loperamide, ondansetron **OR** ondansetron (ZOFRAN) IV, sodium chloride flush   Time Spent in minutes  25  See all Orders from today for further details   Oren Binet M.D on 09/15/2019 at 3:24 PM  To page go to [email or web address] - use  universal password  [facility] -  Office  [phone]    Objective:   Vitals:   09/14/19 1536 09/14/19 2036 09/15/19 0334 09/15/19 0722  BP: 135/63 125/72 124/84 118/73  Pulse: 75 81 70 74  Resp: (!) 22 18 (!) 24 18  Temp: 98.2 F (36.8 C) 97.6 F (36.4 C) 98.3 F (36.8 C) 98.9 F (37.2 C)  TempSrc: Oral Oral Oral Oral  SpO2: 95% 94% 92% 91%  Weight:      Height:        Wt Readings from Last 3 Encounters:  09/13/19 94.2 kg  04/11/13 104.3 kg      Intake/Output Summary (Last 24 hours) at 09/15/2019 1524 Last data filed at 09/15/2019 1410 Gross per 24 hour  Intake 4088.62 ml  Output --  Net 4088.62 ml     Physical Exam Gen Exam:Alert awake-not in any distress HEENT:atraumatic, normocephalic Chest: B/L clear to auscultation anteriorly CVS:S1S2 regular Abdomen:soft non tender, non distended Extremities:no edema Neurology: Non focal Skin: no rash   Data Review:    CBC Recent Labs  Lab 09/13/19 1355 09/13/19 2029 09/14/19 0257 09/15/19 0244  WBC 5.6 5.5 4.1 7.3  HGB 14.5 13.1 14.3 14.7  HCT 44.1 40.4 43.7 44.4  PLT 124* 115* 131* 139*  MCV 90.6 89.6 89.9 89.2  MCH 29.8 29.0 29.4 29.5  MCHC 32.9 32.4 32.7 33.1  RDW 12.3 12.4 12.6 12.9  LYMPHSABS  --   --  0.6* 1.1  MONOABS  --   --  0.2 0.5  EOSABS  --   --  0.0 0.0  BASOSABS  --   --  0.0 0.0    Chemistries  Recent Labs  Lab 09/13/19 1355 09/13/19 1714 09/14/19 0257 09/15/19 0244  NA 133*  --  137 139  K 3.2*  --  4.1 4.2  CL 99  --  106 109  CO2 21*  --  20* 21*  GLUCOSE 261*  --  339* 219*  BUN 11  --  12 12  CREATININE 0.95 0.83 0.75 0.65  CALCIUM 8.4*  --  8.0* 8.5*  MG  --  1.7 1.9 1.9  AST 47* 42* 45* 37  ALT 34 26 34 32  ALKPHOS 53 41 49 47  BILITOT 0.7 2.0* 0.6 0.7   ------------------------------------------------------------------------------------------------------------------ No results for input(s): CHOL, HDL, LDLCALC, TRIG, CHOLHDL, LDLDIRECT in the last 72 hours.  Lab Results  Component Value Date   HGBA1C 14.3 (H) 09/13/2019   ------------------------------------------------------------------------------------------------------------------ No results for input(s): TSH, T4TOTAL, T3FREE, THYROIDAB in the last 72 hours.  Invalid input(s): FREET3 ------------------------------------------------------------------------------------------------------------------ Recent Labs    09/14/19 0257 09/15/19 0244  FERRITIN 659*  626*    Coagulation profile Recent Labs  Lab 09/13/19 1355  INR 1.1    Recent Labs    09/14/19 0257 09/15/19 0244  DDIMER 0.77* 0.91*    Cardiac Enzymes No results for input(s): CKMB, TROPONINI, MYOGLOBIN in the last 168 hours.  Invalid input(s): CK ------------------------------------------------------------------------------------------------------------------ No results found for: BNP  Micro Results Recent Results (from the past 240 hour(s))  Blood Culture (routine x 2)     Status: None (Preliminary result)   Collection Time: 09/13/19  1:55 PM   Specimen: BLOOD  Result Value Ref Range Status   Specimen Description   Final    BLOOD RIGHT ANTECUBITAL Performed at [facility], [place] [address] [place], [place] [address]    Special Requests   Final    BOTTLES DRAWN AEROBIC AND  ANAEROBIC Blood Culture adequate volume Performed at [place] [address] [place], [place] [address]    Culture   Final    NO GROWTH 2 DAYS Performed at [facility] [address], [place], [place] [address]    Report Status PENDING  Incomplete  Blood Culture (routine x 2)     Status: None (Preliminary result)   Collection Time: 09/13/19  1:55 PM   Specimen: BLOOD  Result Value Ref Range Status   Specimen Description   Final    BLOOD LEFT ANTECUBITAL Performed at [facility], [place] [address], [place], [place] [address]    Special Requests   Final    BOTTLES DRAWN AEROBIC AND ANAEROBIC Blood Culture adequate volume Performed at [place] [address] [place], [place] [address]    Culture   Final    NO GROWTH 2 DAYS Performed at [place] [address] [place], [place] [address]    Report Status PENDING  Incomplete  Urine culture     Status: Abnormal   Collection Time: 09/13/19  1:55 PM   Specimen: In/Out Cath Urine  Result Value Ref Range Status   Specimen Description   Final    IN/OUT CATH  URINE Performed at [place] [address] [place], [place] [address]    Special Requests   Final    NONE Performed at [facility], [place] [address] [place], [place] [address]    Culture MULTIPLE SPECIES PRESENT, SUGGEST RECOLLECTION (A)  Final   Report Status 09/14/2019 FINAL  Final  Respiratory Panel by PCR     Status: None   Collection Time: 09/13/19  5:15 PM   Specimen: Respiratory  Result Value Ref Range Status   Adenovirus NOT DETECTED NOT DETECTED Final   Coronavirus 229E NOT DETECTED NOT DETECTED Final    Comment: (NOTE) The Coronavirus on the Respiratory Panel, DOES NOT test for the novel  Coronavirus (2019 nCoV)    Coronavirus HKU1 NOT DETECTED NOT DETECTED Final   Coronavirus NL63 NOT DETECTED NOT DETECTED Final   Coronavirus OC43 NOT DETECTED NOT DETECTED Final   Metapneumovirus NOT DETECTED NOT DETECTED Final   Rhinovirus / Enterovirus NOT DETECTED NOT DETECTED Final   Influenza A NOT DETECTED NOT DETECTED Final   Influenza B NOT DETECTED NOT DETECTED Final   Parainfluenza Virus 1 NOT DETECTED NOT DETECTED Final   Parainfluenza Virus 2 NOT DETECTED NOT DETECTED Final   Parainfluenza Virus 3 NOT DETECTED NOT DETECTED Final   Parainfluenza Virus 4 NOT DETECTED NOT DETECTED Final   Respiratory Syncytial Virus NOT DETECTED NOT DETECTED Final   Bordetella pertussis NOT DETECTED NOT DETECTED Final   Chlamydophila pneumoniae NOT DETECTED NOT DETECTED Final   Mycoplasma pneumoniae NOT DETECTED NOT DETECTED Final    Comment: Performed at [facility], [place]. [address] [place], [place] [address]    Radiology Reports Ct Angio Chest Pe W And/or Wo Contrast  Result Date: 09/13/2019 CLINICAL DATA:  Patient with history of COVID-19. Abdominal pain. Evaluate for pulmonary embolus. EXAM: CT CHEST, ABDOMEN, AND PELVIS WITH CONTRAST TECHNIQUE: Multidetector CT imaging of the chest, abdomen and pelvis was performed following the  standard protocol during bolus administration of intravenous contrast. CONTRAST:  188mL OMNIPAQUE IOHEXOL 350 MG/ML SOLN COMPARISON:  CT abdomen pelvis 12/15/2009. FINDINGS: CT CHEST FINDINGS Cardiovascular: Normal heart size. Aorta main pulmonary artery normal in caliber. Adequate opacification of the  pulmonary arterial system. Motion artifact limits evaluation. Within the above limitation, no intraluminal filling defect identified to suggest acute pulmonary embolus. Mediastinum/Nodes: No enlarged axillary, mediastinal or hilar lymphadenopathy. Normal appearance of the esophagus. Lungs/Pleura: Central airways are patent. Sharply marginated patchy areas of ground-glass attenuation are demonstrated within the lungs bilaterally. No pleural effusion or pneumothorax. Musculoskeletal: No aggressive or acute appearing osseous lesions. CT ABDOMEN PELVIS FINDINGS Hepatobiliary: The liver is normal in size and contour. Gallbladder is unremarkable. No intrahepatic or extrahepatic biliary ductal dilatation. Pancreas: Unremarkable Spleen: Unremarkable Adrenals/Urinary Tract: Normal adrenal glands. Kidneys enhance symmetrically with contrast. Postsurgical changes involving the midpole of the right kidney. Subcentimeter too small to characterize low-attenuation lesion superior pole right kidney. Urinary bladder is unremarkable. Stomach/Bowel: There are a few fluid-filled loops of small bowel (2 cm) within the left hemiabdomen (image 45; series 3). No abnormal bowel wall thickening. No free fluid or free intraperitoneal air. Normal morphology of the stomach. Vascular/Lymphatic: Normal caliber abdominal aorta. No retroperitoneal lymphadenopathy. Reproductive: Status post hysterectomy. The right ovary is enlarged measuring up to 5.7 x 3.8 cm and contains multiple cystic structures. Prominent follicle within the left ovary. Other: None. Musculoskeletal: Lumbar spine degenerative changes. No aggressive or acute appearing osseous  lesions. IMPRESSION: 1. No evidence for acute pulmonary embolus. 2. Bilateral patchy areas of ground-glass and consolidation most compatible with history of COVID-19. 3. Few fluid-filled loops of bowel within the left hemiabdomen are nonspecific however may be secondary to ileus. 4. The right ovary is enlarged and contains multiple cystic structures. This needs further evaluation pelvic ultrasound. Electronically Signed   By: Lovey Newcomer M.D.   On: 09/13/2019 16:43   Ct Abdomen Pelvis W Contrast  Result Date: 09/13/2019 CLINICAL DATA:  Patient with history of COVID-19. Abdominal pain. Evaluate for pulmonary embolus. EXAM: CT CHEST, ABDOMEN, AND PELVIS WITH CONTRAST TECHNIQUE: Multidetector CT imaging of the chest, abdomen and pelvis was performed following the standard protocol during bolus administration of intravenous contrast. CONTRAST:  130mL OMNIPAQUE IOHEXOL 350 MG/ML SOLN COMPARISON:  CT abdomen pelvis 12/15/2009. FINDINGS: CT CHEST FINDINGS Cardiovascular: Normal heart size. Aorta main pulmonary artery normal in caliber. Adequate opacification of the pulmonary arterial system. Motion artifact limits evaluation. Within the above limitation, no intraluminal filling defect identified to suggest acute pulmonary embolus. Mediastinum/Nodes: No enlarged axillary, mediastinal or hilar lymphadenopathy. Normal appearance of the esophagus. Lungs/Pleura: Central airways are patent. Sharply marginated patchy areas of ground-glass attenuation are demonstrated within the lungs bilaterally. No pleural effusion or pneumothorax. Musculoskeletal: No aggressive or acute appearing osseous lesions. CT ABDOMEN PELVIS FINDINGS Hepatobiliary: The liver is normal in size and contour. Gallbladder is unremarkable. No intrahepatic or extrahepatic biliary ductal dilatation. Pancreas: Unremarkable Spleen: Unremarkable Adrenals/Urinary Tract: Normal adrenal glands. Kidneys enhance symmetrically with contrast. Postsurgical changes  involving the midpole of the right kidney. Subcentimeter too small to characterize low-attenuation lesion superior pole right kidney. Urinary bladder is unremarkable. Stomach/Bowel: There are a few fluid-filled loops of small bowel (2 cm) within the left hemiabdomen (image 45; series 3). No abnormal bowel wall thickening. No free fluid or free intraperitoneal air. Normal morphology of the stomach. Vascular/Lymphatic: Normal caliber abdominal aorta. No retroperitoneal lymphadenopathy. Reproductive: Status post hysterectomy. The right ovary is enlarged measuring up to 5.7 x 3.8 cm and contains multiple cystic structures. Prominent follicle within the left ovary. Other: None. Musculoskeletal: Lumbar spine degenerative changes. No aggressive or acute appearing osseous lesions. IMPRESSION: 1. No evidence for acute pulmonary embolus. 2. Bilateral patchy areas of ground-glass and  consolidation most compatible with history of COVID-19. 3. Few fluid-filled loops of bowel within the left hemiabdomen are nonspecific however may be secondary to ileus. 4. The right ovary is enlarged and contains multiple cystic structures. This needs further evaluation pelvic ultrasound. Electronically Signed   By: Lovey Newcomer M.D.   On: 09/13/2019 16:43   Dg Chest Port 1 View  Result Date: 09/13/2019 CLINICAL DATA:  Abdominal pain. Sepsis. EXAM: PORTABLE CHEST 1 VIEW COMPARISON:  None. FINDINGS: Bilateral pulmonary infiltrates are identified. No pneumothorax. The heart, hila, mediastinum, lungs, and pleura are otherwise normal. IMPRESSION: Bilateral pulmonary infiltrates are identified. Atypical infections should be considered. Electronically Signed   By: Dorise Bullion III M.D   On: 09/13/2019 13:32

## 2019-09-15 NOTE — Progress Notes (Signed)
Patient seen and assessed. Patient found resting in bed. No acute distress noted. Physical assessment completed via computerized charting per [facility] policy. Staff nurse makes inquiry related to notifying family later this PM for updates in plan of care. Patient states that her brother, Joy Phillips, is listed as a contact and gives staff nurse permission to call. Side rails up times two. Bed in lowest position and locked. Linens reorganized per patient request. Call light in reach. Continue to monitor.

## 2019-09-16 LAB — COMPREHENSIVE METABOLIC PANEL
ALT: 35 U/L (ref 0–44)
AST: 42 U/L — ABNORMAL HIGH (ref 15–41)
Albumin: 2.8 g/dL — ABNORMAL LOW (ref 3.5–5.0)
Alkaline Phosphatase: 52 U/L (ref 38–126)
Anion gap: 9 (ref 5–15)
BUN: 12 mg/dL (ref 6–20)
CO2: 23 mmol/L (ref 22–32)
Calcium: 8.6 mg/dL — ABNORMAL LOW (ref 8.9–10.3)
Chloride: 108 mmol/L (ref 98–111)
Creatinine, Ser: 0.6 mg/dL (ref 0.44–1.00)
GFR calc Af Amer: >60 mL/min (ref >60)
GFR calc non Af Amer: >60 mL/min (ref >60)
Glucose, Bld: 140 mg/dL — ABNORMAL HIGH (ref 70–99)
Potassium: 3.5 mmol/L (ref 3.5–5.1)
Sodium: 140 mmol/L (ref 135–145)
Total Bilirubin: 0.7 mg/dL (ref 0.3–1.2)
Total Protein: 6.1 g/dL — ABNORMAL LOW (ref 6.5–8.1)

## 2019-09-16 LAB — CBC WITH DIFFERENTIAL/PLATELET
Abs Immature Granulocytes: 0.16 10*3/uL — ABNORMAL HIGH (ref 0.00–0.07)
Basophils Absolute: 0 10*3/uL (ref 0.0–0.1)
Basophils Relative: 0 %
Eosinophils Absolute: 0 10*3/uL (ref 0.0–0.5)
Eosinophils Relative: 0 %
HCT: 40.4 % (ref 36.0–46.0)
Hemoglobin: 13.4 g/dL (ref 12.0–15.0)
Immature Granulocytes: 2 %
Lymphocytes Relative: 10 %
Lymphs Abs: 0.9 10*3/uL (ref 0.7–4.0)
MCH: 29.3 pg (ref 26.0–34.0)
MCHC: 33.2 g/dL (ref 30.0–36.0)
MCV: 88.4 fL (ref 80.0–100.0)
Monocytes Absolute: 0.6 10*3/uL (ref 0.1–1.0)
Monocytes Relative: 7 %
Neutro Abs: 7.2 10*3/uL (ref 1.7–7.7)
Neutrophils Relative %: 81 %
Platelets: 161 10*3/uL (ref 150–400)
RBC: 4.57 MIL/uL (ref 3.87–5.11)
RDW: 13 % (ref 11.5–15.5)
WBC: 8.9 10*3/uL (ref 4.0–10.5)
nRBC: 0 % (ref 0.0–0.2)

## 2019-09-16 LAB — D-DIMER, QUANTITATIVE: D-Dimer, Quant: 0.74 ug/mL-FEU — ABNORMAL HIGH (ref 0.00–0.50)

## 2019-09-16 LAB — GLUCOSE, CAPILLARY
Glucose-Capillary: 176 mg/dL — ABNORMAL HIGH (ref 70–99)
Glucose-Capillary: 173 mg/dL — ABNORMAL HIGH (ref 70–99)
Glucose-Capillary: 247 mg/dL — ABNORMAL HIGH (ref 70–99)
Glucose-Capillary: 269 mg/dL — ABNORMAL HIGH (ref 70–99)

## 2019-09-16 LAB — FERRITIN: Ferritin: 473 ng/mL — ABNORMAL HIGH (ref 11–307)

## 2019-09-16 LAB — C-REACTIVE PROTEIN: CRP: 1.2 mg/dL — ABNORMAL HIGH (ref <1.0)

## 2019-09-16 LAB — MAGNESIUM: Magnesium: 1.7 mg/dL (ref 1.7–2.4)

## 2019-09-16 NOTE — Progress Notes (Signed)
Inpatient Diabetes Program Recommendations  AACE/ADA: New Consensus Statement on Inpatient Glycemic Control (2015)  Target Ranges:  Prepandial:   less than 140 mg/dL      Peak postprandial:   less than 180 mg/dL (1-2 hours)      Critically ill patients:  140 - 180 mg/dL   Lab Results  Component Value Date   GLUCAP 176 (H) 09/16/2019   HGBA1C 14.3 (H) 09/13/2019    Review of Glycemic Control  Diabetes history: DM 2 Outpatient Diabetes medications: Levemir 10 units qhs started on Nov 14th by PCP Current orders for Inpatient glycemic control:  Levemir 15 units bid Novolog 0-20 units tid + hs Novolog 4 units tid meal coverage  A1c 14.3% Solumedrol 60 mg Q12 hours  Inpatient Diabetes Program Recommendations:    Spoke with patient in regards to glycemic control at home. Pt had just seen her PCP Andris Flurry at [facility] in [place]) November 14th. At that time pt was placed on Levemir. Pt was not on medications prior to this. Pt thought she could not afford oral medications. Discussed metformin and sulfonylureas to pt. Pt is open to sulfonylureas but not to metformin due to potential side effects.  Pt reports having the same glucometer for years. Pt reports needing a new glucometer.   Discussed A1c level. Discussed glucose and A1c goals. Pt reports checking glucose twice a day at home when she can afford test strips and her glucose is in the 200 range.  Discussed the possibly need to increase her insulin at time of d/c. Explained new orders and medications will be listed in her discharge paperwork. Pt denies questions or concerns at this time.  Thanks,  Tama Headings RN, MSN, BC-ADM Inpatient Diabetes Coordinator Team Pager [phone] (8a-5p)

## 2019-09-16 NOTE — Progress Notes (Signed)
Family update  Called patients brother Loa Socks to give update on POC/discharge planning and move to 3rd floor of [facility]. Provided diabetes teaching and brother verbalized comprehension of teaching.

## 2019-09-16 NOTE — Progress Notes (Signed)
Family contact  Called and left message to update family/brother with POC and d/c plan.

## 2019-09-16 NOTE — Progress Notes (Signed)
PROGRESS NOTE                                                                                                                                                                                                             Patient Demographics:    Joy Phillips, is a 48 y.o. female, DOB - [birth year], MC:[id number]  Outpatient Primary MD for the patient is Gwendalyn Ege, MD   Admit date - 09/13/2019   LOS - 3  Chief Complaint  Patient presents with   Hyperglycemia   Fever       Brief Narrative: Patient is a 48 y.o. female with PMHx of DM-2 presented with a 2-3-day history of diarrhea, cough and dyspnea, she was found to have COVID-19 pneumonia and admitted to the hospitalist service.  See below for further details.   Subjective:    Joy Phillips feels better-diarrhea has resolved.  She is on room air.   Assessment  & Plan :   Sepsis secondary to COVID-19 PNA: Sepsis physiology has resolved-she remains on room air-continue steroids and remdesivir.  Fever: afebrile/  O2 requirements: On RA SpO2: 92 %   COVID-19 Labs: Recent Labs    09/13/19 1714 09/14/19 0257 09/15/19 0244 09/16/19 0300  DDIMER  --  0.77* 0.91* 0.74*  FERRITIN 760* 659* 626* 473*  LDH 365*  --   --   --   CRP 5.2* 6.1* 2.7* 1.2*    No results found for: SARSCOV2NAA   COVID-19 Medications: Steroids: 11/29>> Remdesivir: 11/29>> Actemra: Not indicated-given no hypoxia Convalescent Plasma: Hold off for now  Other medications: Diuretics:Euvolemic-no need for lasix Antibiotics:Not needed as no evidence of bacterial infection-stop cefepime/Vanco Vancomycin/cefepime: 11/29>> 11/30  Prone/Incentive Spirometry: encouraged incentive spirometry use 3-4/hour.  DVT Prophylaxis  :  Lovenox   Gastroenteritis secondary to COVID-19: Diarrhea resolved-continue as needed Imodium.   Uncontrolled DM-2 with hyperglycemia (A1c 14.3 on 11/29): CBGs better  controlled-continue Levemir 15 units twice daily, 4 units of NovoLog with meals and resistant SSI.   CBG (last 3)  Recent Labs    09/15/19 2303 09/16/19 0755 09/16/19 1225  GLUCAP 121* 176* 173*    Right ovarian cyst: Seen incidentally on CT-pelvic ultrasound shows complicated cystic lesions in both the ovaries-recommendations from radiology are for short interval follow-up transabdominal/transvaginal ultrasound in 6 to 12 weeks.  Asymptomatic bacteriuria: Does  not have any symptoms of UTI-no need for antibiotics.    Obesity: Estimated body mass index is 32.51 kg/m as calculated from the following:   Height as of this encounter: 5\' 7"  (1.702 m).   Weight as of this encounter: 94.2 kg.    Consults  :  None  Procedures  :  None  ABG: No results found for: PHART, PCO2ART, PO2ART, HCO3, TCO2, ACIDBASEDEF, O2SAT  Vent Settings: N/A  Condition - Stable  Family Communication  : Called brother-no response-  Code Status :  Full Code  Diet :  Diet Order            Diet heart healthy/carb modified Room service appropriate? Yes; Fluid consistency: Thin  Diet effective now               Disposition Plan  :  Remain hospitalized  Barriers to discharge: complete 5 days of IV Remdesivir  Antimicorbials  :    Anti-infectives (From admission, onward)   Start     Dose/Rate Route Frequency Ordered Stop   09/15/19 1600  remdesivir 100 mg in sodium chloride 0.9 % 250 mL IVPB     100 mg 500 mL/hr over 30 Minutes Intravenous Daily 09/15/19 1320 09/18/19 0959   09/14/19 2200  remdesivir 100 mg in sodium chloride 0.9 % 250 mL IVPB  Status:  Discontinued     100 mg 500 mL/hr over 30 Minutes Intravenous Every 24 hours 09/13/19 1954 09/15/19 1320   09/13/19 2200  vancomycin (VANCOCIN) IVPB 750 mg/150 ml premix  Status:  Discontinued     750 mg 150 mL/hr over 60 Minutes Intravenous Every 12 hours 09/13/19 1920 09/14/19 0929   09/13/19 2200  ceFEPIme (MAXIPIME) 2 g in sodium chloride  0.9 % 100 mL IVPB  Status:  Discontinued     2 g 200 mL/hr over 30 Minutes Intravenous Every 8 hours 09/13/19 1920 09/14/19 0929   09/13/19 2000  remdesivir 200 mg in sodium chloride 0.9 % 250 mL IVPB     200 mg 500 mL/hr over 30 Minutes Intravenous Once 09/13/19 1954 09/13/19 2116   09/13/19 1245  ceFEPIme (MAXIPIME) 2 g in sodium chloride 0.9 % 100 mL IVPB     2 g 200 mL/hr over 30 Minutes Intravenous  Once 09/13/19 1240 09/13/19 1350   09/13/19 1245  metroNIDAZOLE (FLAGYL) IVPB 500 mg     500 mg 100 mL/hr over 60 Minutes Intravenous  Once 09/13/19 1240 09/13/19 1619   09/13/19 1245  vancomycin (VANCOCIN) IVPB 1000 mg/200 mL premix     1,000 mg 200 mL/hr over 60 Minutes Intravenous  Once 09/13/19 1240 09/13/19 1721      Inpatient Medications  Scheduled Meds:  enoxaparin (LOVENOX) injection  40 mg Subcutaneous Q24H   insulin aspart  0-20 Units Subcutaneous TID WC   insulin aspart  0-5 Units Subcutaneous QHS   insulin aspart  4 Units Subcutaneous TID WC   insulin detemir  15 Units Subcutaneous BID   methylPREDNISolone (SOLU-MEDROL) injection  60 mg Intravenous Q12H   potassium chloride  40 mEq Oral Once   sodium chloride flush  3 mL Intravenous Q12H   Continuous Infusions:  sodium chloride     remdesivir 100 mg in NS 250 mL 100 mg (09/16/19 0838)   PRN Meds:.sodium chloride, acetaminophen, albuterol, benzonatate, loperamide, ondansetron **OR** ondansetron (ZOFRAN) IV, sodium chloride flush   Time Spent in minutes  25  See all Orders from today for further details   Corin Tilly  Henrick Mcgue M.D on 09/16/2019 at 3:13 PM  To page go to [email or web address] - use universal password  [facility] -  Office  [phone]    Objective:   Vitals:   09/15/19 1530 09/15/19 1941 09/16/19 0412 09/16/19 0752  BP: 136/82 132/84 126/85 124/85  Pulse: 72  70 71  Resp: 18 (!) 26 (!) 22 (!) 22  Temp: 98.7 F (37.1 C) 97.9 F (36.6 C) 98.8 F (37.1 C) 98.2 F (36.8 C)    TempSrc: Oral Oral Oral Oral  SpO2: 91%  92% 92%  Weight:      Height:        Wt Readings from Last 3 Encounters:  09/13/19 94.2 kg  04/11/13 104.3 kg     Intake/Output Summary (Last 24 hours) at 09/16/2019 1513 Last data filed at 09/16/2019 1331 Gross per 24 hour  Intake 970 ml  Output 2150 ml  Net -1180 ml     Physical Exam Gen Exam:Alert awake-not in any distress HEENT:atraumatic, normocephalic Chest: B/L clear to auscultation anteriorly CVS:S1S2 regular Abdomen:soft non tender, non distended Extremities:no edema Neurology: Non focal Skin: no rash   Data Review:    CBC Recent Labs  Lab 09/13/19 1355 09/13/19 2029 09/14/19 0257 09/15/19 0244 09/16/19 0300  WBC 5.6 5.5 4.1 7.3 8.9  HGB 14.5 13.1 14.3 14.7 13.4  HCT 44.1 40.4 43.7 44.4 40.4  PLT 124* 115* 131* 139* 161  MCV 90.6 89.6 89.9 89.2 88.4  MCH 29.8 29.0 29.4 29.5 29.3  MCHC 32.9 32.4 32.7 33.1 33.2  RDW 12.3 12.4 12.6 12.9 13.0  LYMPHSABS  --   --  0.6* 1.1 0.9  MONOABS  --   --  0.2 0.5 0.6  EOSABS  --   --  0.0 0.0 0.0  BASOSABS  --   --  0.0 0.0 0.0    Chemistries  Recent Labs  Lab 09/13/19 1355 09/13/19 1714 09/14/19 0257 09/15/19 0244 09/16/19 0300  NA 133*  --  137 139 140  K 3.2*  --  4.1 4.2 3.5  CL 99  --  106 109 108  CO2 21*  --  20* 21* 23  GLUCOSE 261*  --  339* 219* 140*  BUN 11  --  12 12 12   CREATININE 0.95 0.83 0.75 0.65 0.60  CALCIUM 8.4*  --  8.0* 8.5* 8.6*  MG  --  1.7 1.9 1.9 1.7  AST 47* 42* 45* 37 42*  ALT 34 26 34 32 35  ALKPHOS 53 41 49 47 52  BILITOT 0.7 2.0* 0.6 0.7 0.7   ------------------------------------------------------------------------------------------------------------------ No results for input(s): CHOL, HDL, LDLCALC, TRIG, CHOLHDL, LDLDIRECT in the last 72 hours.  Lab Results  Component Value Date   HGBA1C 14.3 (H) 09/13/2019    ------------------------------------------------------------------------------------------------------------------ No results for input(s): TSH, T4TOTAL, T3FREE, THYROIDAB in the last 72 hours.  Invalid input(s): FREET3 ------------------------------------------------------------------------------------------------------------------ Recent Labs    09/15/19 0244 09/16/19 0300  FERRITIN 626* 473*    Coagulation profile Recent Labs  Lab 09/13/19 1355  INR 1.1    Recent Labs    09/15/19 0244 09/16/19 0300  DDIMER 0.91* 0.74*    Cardiac Enzymes No results for input(s): CKMB, TROPONINI, MYOGLOBIN in the last 168 hours.  Invalid input(s): CK ------------------------------------------------------------------------------------------------------------------ No results found for: BNP  Micro Results Recent Results (from the past 240 hour(s))  Blood Culture (routine x 2)     Status: None (Preliminary result)   Collection Time: 09/13/19  1:55 PM   Specimen: BLOOD  Result Value Ref Range Status   Specimen Description   Final    BLOOD RIGHT ANTECUBITAL Performed at [facility], [place] [address] [place], [place] [address]    Special Requests   Final    BOTTLES DRAWN AEROBIC AND ANAEROBIC Blood Culture adequate volume Performed at [facility] [address] [place], [place] [address]    Culture   Final    NO GROWTH 3 DAYS Performed at [facility], [place] [address], [place], [place] [address]    Report Status PENDING  Incomplete  Blood Culture (routine x 2)     Status: None (Preliminary result)   Collection Time: 09/13/19  1:55 PM   Specimen: BLOOD  Result Value Ref Range Status   Specimen Description   Final    BLOOD LEFT ANTECUBITAL Performed at [facility], [place] [address] [place], [place] [address]    Special Requests   Final    BOTTLES DRAWN AEROBIC AND ANAEROBIC Blood Culture adequate volume Performed at [place] [address], [place], [place] [address]    Culture   Final    NO GROWTH 3 DAYS Performed at [facility], [place] [address], [place], [place] [address]    Report Status PENDING  Incomplete  Urine culture     Status: Abnormal   Collection Time: 09/13/19  1:55 PM   Specimen: In/Out Cath Urine  Result Value Ref Range Status   Specimen Description   Final    IN/OUT CATH URINE Performed at [facility] [address] [place], [place] [address]    Special Requests   Final    NONE Performed at [facility], [place] [address], [place], [place] [address]    Culture MULTIPLE SPECIES PRESENT, SUGGEST RECOLLECTION (A)  Final   Report Status 09/14/2019 FINAL  Final  Respiratory Panel by PCR     Status: None   Collection Time: 09/13/19  5:15 PM   Specimen: Respiratory  Result Value Ref Range Status   Adenovirus NOT DETECTED NOT DETECTED Final   Coronavirus 229E NOT DETECTED NOT DETECTED Final    Comment: (NOTE) The Coronavirus on the Respiratory Panel, DOES NOT test for the novel  Coronavirus (2019 nCoV)    Coronavirus HKU1 NOT DETECTED NOT DETECTED Final   Coronavirus NL63 NOT DETECTED NOT DETECTED Final   Coronavirus OC43 NOT DETECTED NOT DETECTED Final   Metapneumovirus NOT DETECTED NOT DETECTED Final   Rhinovirus / Enterovirus NOT DETECTED NOT DETECTED Final   Influenza A NOT DETECTED NOT DETECTED Final   Influenza B NOT DETECTED NOT DETECTED Final   Parainfluenza Virus 1 NOT DETECTED NOT DETECTED Final   Parainfluenza Virus 2 NOT DETECTED NOT DETECTED Final   Parainfluenza Virus 3 NOT DETECTED NOT DETECTED Final   Parainfluenza Virus 4 NOT DETECTED NOT DETECTED Final   Respiratory Syncytial Virus NOT DETECTED NOT DETECTED Final   Bordetella pertussis NOT DETECTED NOT DETECTED Final   Chlamydophila pneumoniae NOT DETECTED NOT DETECTED Final   Mycoplasma pneumoniae NOT DETECTED NOT DETECTED Final    Comment: Performed  at [facility], [place]. [address], [place],  [address]    Radiology Reports Ct Angio Chest Pe W And/or Wo Contrast  Result Date: 09/13/2019 CLINICAL DATA:  Patient with history of COVID-19. Abdominal pain. Evaluate for pulmonary embolus. EXAM: CT CHEST, ABDOMEN, AND PELVIS WITH CONTRAST TECHNIQUE: Multidetector CT imaging of the chest, abdomen and  pelvis was performed following the standard protocol during bolus administration of intravenous contrast. CONTRAST:  115mL OMNIPAQUE IOHEXOL 350 MG/ML SOLN COMPARISON:  CT abdomen pelvis 12/15/2009. FINDINGS: CT CHEST FINDINGS Cardiovascular: Normal heart size. Aorta main pulmonary artery normal in caliber. Adequate opacification of the pulmonary arterial system. Motion artifact limits evaluation. Within the above limitation, no intraluminal filling defect identified to suggest acute pulmonary embolus. Mediastinum/Nodes: No enlarged axillary, mediastinal or hilar lymphadenopathy. Normal appearance of the esophagus. Lungs/Pleura: Central airways are patent. Sharply marginated patchy areas of ground-glass attenuation are demonstrated within the lungs bilaterally. No pleural effusion or pneumothorax. Musculoskeletal: No aggressive or acute appearing osseous lesions. CT ABDOMEN PELVIS FINDINGS Hepatobiliary: The liver is normal in size and contour. Gallbladder is unremarkable. No intrahepatic or extrahepatic biliary ductal dilatation. Pancreas: Unremarkable Spleen: Unremarkable Adrenals/Urinary Tract: Normal adrenal glands. Kidneys enhance symmetrically with contrast. Postsurgical changes involving the midpole of the right kidney. Subcentimeter too small to characterize low-attenuation lesion superior pole right kidney. Urinary bladder is unremarkable. Stomach/Bowel: There are a few fluid-filled loops of small bowel (2 cm) within the left hemiabdomen (image 45; series 3). No abnormal bowel wall thickening. No free fluid or free intraperitoneal air. Normal  morphology of the stomach. Vascular/Lymphatic: Normal caliber abdominal aorta. No retroperitoneal lymphadenopathy. Reproductive: Status post hysterectomy. The right ovary is enlarged measuring up to 5.7 x 3.8 cm and contains multiple cystic structures. Prominent follicle within the left ovary. Other: None. Musculoskeletal: Lumbar spine degenerative changes. No aggressive or acute appearing osseous lesions. IMPRESSION: 1. No evidence for acute pulmonary embolus. 2. Bilateral patchy areas of ground-glass and consolidation most compatible with history of COVID-19. 3. Few fluid-filled loops of bowel within the left hemiabdomen are nonspecific however may be secondary to ileus. 4. The right ovary is enlarged and contains multiple cystic structures. This needs further evaluation pelvic ultrasound. Electronically Signed   By: Lovey Newcomer M.D.   On: 09/13/2019 16:43   US Pelvis Complete  Result Date: 09/16/2019 CLINICAL DATA:  Ovarian cyst EXAM: TRANSABDOMINAL ULTRASOUND OF PELVIS TECHNIQUE: Transabdominal ultrasound examination of the pelvis was performed including evaluation of the uterus, ovaries, adnexal regions, and pelvic cul-de-sac. COMPARISON:  CT abdomen and pelvis 09/13/2019 FINDINGS: Uterus Surgically absent Endometrium N/A Right ovary Measurements: 7.7 x 3.33.4 cm = volume: 46.2 mL. Complex appearing RIGHT ovary containing 3 small cysts, largest 2.7 x 1.3 x 1.8 cm. All 3 lesions contain scattered low level internal echogenicity. No definite septations or mural nodularity. No solid mass. Left ovary Measurements: 3.6 x 1.9 x 3.3 cm = volume: 11.7 mL. Hypoechoic nodule 1.1 x 2.3 x 1.4 cm, containing diffuse low level internal echogenicity. Other findings: No free pelvic fluid. No other adnexal masses. Visualized bladder unremarkable. IMPRESSION: Surgical absence of uterus. Complicated cystic lesions in both ovaries, with 3 mildly complicated cysts within the RIGHT ovary up to 2.7 cm in size and a single complex  hypoechoic nodule 2.3 cm greatest size within LEFT ovary question hemorrhagic cyst. No transvaginal imaging was ordered, which likely would provide better characterization. Short-interval follow up transabdominal and transvaginal ultrasound in 6-12 weeks is recommended to reassess these lesions for character and stability versus evolution. Electronically Signed   By: Lavonia Dana M.D.   On: 09/16/2019 13:16   Ct Abdomen Pelvis W Contrast  Result Date: 09/13/2019 CLINICAL DATA:  Patient with history of COVID-19. Abdominal pain. Evaluate for pulmonary embolus. EXAM: CT CHEST, ABDOMEN, AND PELVIS WITH CONTRAST TECHNIQUE: Multidetector CT imaging of the chest, abdomen and pelvis was  performed following the standard protocol during bolus administration of intravenous contrast. CONTRAST:  114mL OMNIPAQUE IOHEXOL 350 MG/ML SOLN COMPARISON:  CT abdomen pelvis 12/15/2009. FINDINGS: CT CHEST FINDINGS Cardiovascular: Normal heart size. Aorta main pulmonary artery normal in caliber. Adequate opacification of the pulmonary arterial system. Motion artifact limits evaluation. Within the above limitation, no intraluminal filling defect identified to suggest acute pulmonary embolus. Mediastinum/Nodes: No enlarged axillary, mediastinal or hilar lymphadenopathy. Normal appearance of the esophagus. Lungs/Pleura: Central airways are patent. Sharply marginated patchy areas of ground-glass attenuation are demonstrated within the lungs bilaterally. No pleural effusion or pneumothorax. Musculoskeletal: No aggressive or acute appearing osseous lesions. CT ABDOMEN PELVIS FINDINGS Hepatobiliary: The liver is normal in size and contour. Gallbladder is unremarkable. No intrahepatic or extrahepatic biliary ductal dilatation. Pancreas: Unremarkable Spleen: Unremarkable Adrenals/Urinary Tract: Normal adrenal glands. Kidneys enhance symmetrically with contrast. Postsurgical changes involving the midpole of the right kidney. Subcentimeter too  small to characterize low-attenuation lesion superior pole right kidney. Urinary bladder is unremarkable. Stomach/Bowel: There are a few fluid-filled loops of small bowel (2 cm) within the left hemiabdomen (image 45; series 3). No abnormal bowel wall thickening. No free fluid or free intraperitoneal air. Normal morphology of the stomach. Vascular/Lymphatic: Normal caliber abdominal aorta. No retroperitoneal lymphadenopathy. Reproductive: Status post hysterectomy. The right ovary is enlarged measuring up to 5.7 x 3.8 cm and contains multiple cystic structures. Prominent follicle within the left ovary. Other: None. Musculoskeletal: Lumbar spine degenerative changes. No aggressive or acute appearing osseous lesions. IMPRESSION: 1. No evidence for acute pulmonary embolus. 2. Bilateral patchy areas of ground-glass and consolidation most compatible with history of COVID-19. 3. Few fluid-filled loops of bowel within the left hemiabdomen are nonspecific however may be secondary to ileus. 4. The right ovary is enlarged and contains multiple cystic structures. This needs further evaluation pelvic ultrasound. Electronically Signed   By: Lovey Newcomer M.D.   On: 09/13/2019 16:43   Dg Chest Port 1 View  Result Date: 09/13/2019 CLINICAL DATA:  Abdominal pain. Sepsis. EXAM: PORTABLE CHEST 1 VIEW COMPARISON:  None. FINDINGS: Bilateral pulmonary infiltrates are identified. No pneumothorax. The heart, hila, mediastinum, lungs, and pleura are otherwise normal. IMPRESSION: Bilateral pulmonary infiltrates are identified. Atypical infections should be considered. Electronically Signed   By: Dorise Bullion III M.D   On: 09/13/2019 13:32

## 2019-09-16 NOTE — Progress Notes (Signed)
Patient Transfer  Patient telemetry monitor orders discontinued and patient transferred to 3rd floor of [facility]. Patient and brother updated on the move to room 304 from room 148. Patient removed from telemetry monitor, personal belongings gathered and patient is transported to new room via wheelchair by NT. Report called to Herma Ard RN.

## 2019-09-17 LAB — CBC WITH DIFFERENTIAL/PLATELET
Abs Immature Granulocytes: 0.14 10*3/uL — ABNORMAL HIGH (ref 0.00–0.07)
Basophils Absolute: 0 10*3/uL (ref 0.0–0.1)
Basophils Relative: 1 %
Eosinophils Absolute: 0 10*3/uL (ref 0.0–0.5)
Eosinophils Relative: 0 %
HCT: 40.4 % (ref 36.0–46.0)
Hemoglobin: 13.5 g/dL (ref 12.0–15.0)
Immature Granulocytes: 2 %
Lymphocytes Relative: 14 %
Lymphs Abs: 1 10*3/uL (ref 0.7–4.0)
MCH: 29.5 pg (ref 26.0–34.0)
MCHC: 33.4 g/dL (ref 30.0–36.0)
MCV: 88.2 fL (ref 80.0–100.0)
Monocytes Absolute: 0.9 10*3/uL (ref 0.1–1.0)
Monocytes Relative: 12 %
Neutro Abs: 5.3 10*3/uL (ref 1.7–7.7)
Neutrophils Relative %: 71 %
Platelets: 172 10*3/uL (ref 150–400)
RBC: 4.58 MIL/uL (ref 3.87–5.11)
RDW: 12.9 % (ref 11.5–15.5)
WBC: 7.4 10*3/uL (ref 4.0–10.5)
nRBC: 0 % (ref 0.0–0.2)

## 2019-09-17 LAB — FERRITIN: Ferritin: 451 ng/mL — ABNORMAL HIGH (ref 11–307)

## 2019-09-17 LAB — COMPREHENSIVE METABOLIC PANEL
ALT: 34 U/L (ref 0–44)
AST: 36 U/L (ref 15–41)
Albumin: 2.9 g/dL — ABNORMAL LOW (ref 3.5–5.0)
Alkaline Phosphatase: 50 U/L (ref 38–126)
Anion gap: 10 (ref 5–15)
BUN: 16 mg/dL (ref 6–20)
CO2: 25 mmol/L (ref 22–32)
Calcium: 8.6 mg/dL — ABNORMAL LOW (ref 8.9–10.3)
Chloride: 103 mmol/L (ref 98–111)
Creatinine, Ser: 0.79 mg/dL (ref 0.44–1.00)
GFR calc Af Amer: >60 mL/min (ref >60)
GFR calc non Af Amer: >60 mL/min (ref >60)
Glucose, Bld: 164 mg/dL — ABNORMAL HIGH (ref 70–99)
Potassium: 3.2 mmol/L — ABNORMAL LOW (ref 3.5–5.1)
Sodium: 138 mmol/L (ref 135–145)
Total Bilirubin: 0.8 mg/dL (ref 0.3–1.2)
Total Protein: 6.3 g/dL — ABNORMAL LOW (ref 6.5–8.1)

## 2019-09-17 LAB — GLUCOSE, CAPILLARY
Glucose-Capillary: 172 mg/dL — ABNORMAL HIGH (ref 70–99)
Glucose-Capillary: 182 mg/dL — ABNORMAL HIGH (ref 70–99)

## 2019-09-17 LAB — D-DIMER, QUANTITATIVE: D-Dimer, Quant: 0.52 ug/mL-FEU — ABNORMAL HIGH (ref 0.00–0.50)

## 2019-09-17 LAB — MAGNESIUM: Magnesium: 1.9 mg/dL (ref 1.7–2.4)

## 2019-09-17 LAB — C-REACTIVE PROTEIN: CRP: 0.9 mg/dL (ref <1.0)

## 2019-09-17 MED ORDER — BLOOD GLUCOSE METER KIT: PACK | 0 refills | Status: AC

## 2019-09-17 MED ORDER — METFORMIN HCL 500 MG PO TABS: 500.0000 mg | ORAL_TABLET | Freq: Two times a day (BID) | ORAL | 0 refills | Status: DC

## 2019-09-17 MED ORDER — LEVEMIR FLEXTOUCH 100 UNIT/ML ~~LOC~~ SOPN: 22.0000 [IU] | PEN_INJECTOR | Freq: Every day | SUBCUTANEOUS | 0 refills | Status: AC

## 2019-09-17 MED ORDER — PREDNISONE 10 MG PO TABS: ORAL_TABLET | ORAL | 0 refills | Status: AC

## 2019-09-17 MED ORDER — INSULIN DETEMIR 100 UNIT/ML ~~LOC~~ SOLN
15.0000 [IU] | Freq: Two times a day (BID) | SUBCUTANEOUS | Status: DC
  Administered 2019-09-17: 11:00:00 15 [IU] via SUBCUTANEOUS
  Filled 2019-09-17 (×2): qty 0.15

## 2019-09-17 NOTE — Plan of Care (Signed)
  Problem: Education: Goal: Knowledge of risk factors and measures for prevention of condition will improve Outcome: Progressing   Problem: Coping: Goal: Psychosocial and spiritual needs will be supported Outcome: Progressing   Problem: Respiratory: Goal: Will maintain a patent airway Outcome: Progressing Goal: Complications related to the disease process, condition or treatment will be avoided or minimized Outcome: Progressing   Problem: Education: Goal: Knowledge of General Education information will improve Description: Including pain rating scale, medication(s)/side effects and non-pharmacologic comfort measures Outcome: Progressing   Problem: Health Behavior/Discharge Planning: Goal: Ability to manage health-related needs will improve Outcome: Progressing   Problem: Clinical Measurements: Goal: Ability to maintain clinical measurements within normal limits will improve Outcome: Progressing Goal: Will remain free from infection Outcome: Progressing Goal: Diagnostic test results will improve Outcome: Progressing Goal: Respiratory complications will improve Outcome: Progressing Goal: Cardiovascular complication will be avoided Outcome: Progressing   Problem: Activity: Goal: Risk for activity intolerance will decrease Outcome: Progressing   Problem: Nutrition: Goal: Adequate nutrition will be maintained Outcome: Progressing   Problem: Coping: Goal: Level of anxiety will decrease Outcome: Progressing   Problem: Elimination: Goal: Will not experience complications related to bowel motility Outcome: Progressing Goal: Will not experience complications related to urinary retention Outcome: Progressing   Problem: Pain Managment: Goal: General experience of comfort will improve Outcome: Progressing   Problem: Safety: Goal: Ability to remain free from injury will improve Outcome: Progressing   Problem: Skin Integrity: Goal: Risk for impaired skin integrity will  decrease Outcome: Progressing   Problem: Education: Goal: Ability to describe self-care measures that may prevent or decrease complications (Diabetes Survival Skills Education) will improve Outcome: Progressing Goal: Individualized Educational Video(s) Outcome: Progressing   Problem: Coping: Goal: Ability to adjust to condition or change in health will improve Outcome: Progressing   Problem: Fluid Volume: Goal: Ability to maintain a balanced intake and output will improve Outcome: Progressing   Problem: Health Behavior/Discharge Planning: Goal: Ability to identify and utilize available resources and services will improve Outcome: Progressing Goal: Ability to manage health-related needs will improve Outcome: Progressing   Problem: Metabolic: Goal: Ability to maintain appropriate glucose levels will improve Outcome: Progressing   Problem: Nutritional: Goal: Maintenance of adequate nutrition will improve Outcome: Progressing Goal: Progress toward achieving an optimal weight will improve Outcome: Progressing   Problem: Skin Integrity: Goal: Risk for impaired skin integrity will decrease Outcome: Progressing   Problem: Tissue Perfusion: Goal: Adequacy of tissue perfusion will improve Outcome: Progressing   

## 2019-09-17 NOTE — Plan of Care (Signed)
Problem: Education: Goal: Knowledge of risk factors and measures for prevention of condition will improve 09/17/2019 1253 by Herma Ard, RN Outcome: Adequate for Discharge 09/17/2019 0809 by Herma Ard, RN Outcome: Progressing   Problem: Coping: Goal: Psychosocial and spiritual needs will be supported 09/17/2019 1253 by Herma Ard, RN Outcome: Adequate for Discharge 09/17/2019 0809 by Herma Ard, RN Outcome: Progressing   Problem: Respiratory: Goal: Will maintain a patent airway 09/17/2019 1253 by Herma Ard, RN Outcome: Adequate for Discharge 09/17/2019 0809 by Herma Ard, RN Outcome: Progressing Goal: Complications related to the disease process, condition or treatment will be avoided or minimized 09/17/2019 1253 by Herma Ard, RN Outcome: Adequate for Discharge 09/17/2019 0809 by Herma Ard, RN Outcome: Progressing   Problem: Education: Goal: Knowledge of General Education information will improve Description: Including pain rating scale, medication(s)/side effects and non-pharmacologic comfort measures 09/17/2019 1253 by Herma Ard, RN Outcome: Adequate for Discharge 09/17/2019 0809 by Herma Ard, RN Outcome: Progressing   Problem: Health Behavior/Discharge Planning: Goal: Ability to manage health-related needs will improve 09/17/2019 1253 by Herma Ard, RN Outcome: Adequate for Discharge 09/17/2019 0809 by Herma Ard, RN Outcome: Progressing   Problem: Clinical Measurements: Goal: Ability to maintain clinical measurements within normal limits will improve 09/17/2019 1253 by Herma Ard, RN Outcome: Adequate for Discharge 09/17/2019 0809 by Herma Ard, RN Outcome: Progressing Goal: Will remain free from infection 09/17/2019 1253 by Herma Ard, RN Outcome: Adequate for Discharge 09/17/2019 0809 by Herma Ard, RN Outcome: Progressing Goal: Diagnostic test results will  improve 09/17/2019 1253 by Herma Ard, RN Outcome: Adequate for Discharge 09/17/2019 0809 by Herma Ard, RN Outcome: Progressing Goal: Respiratory complications will improve 09/17/2019 1253 by Herma Ard, RN Outcome: Adequate for Discharge 09/17/2019 0809 by Herma Ard, RN Outcome: Progressing Goal: Cardiovascular complication will be avoided 09/17/2019 1253 by Herma Ard, RN Outcome: Adequate for Discharge 09/17/2019 0809 by Herma Ard, RN Outcome: Progressing   Problem: Activity: Goal: Risk for activity intolerance will decrease 09/17/2019 1253 by Herma Ard, RN Outcome: Adequate for Discharge 09/17/2019 0809 by Herma Ard, RN Outcome: Progressing   Problem: Nutrition: Goal: Adequate nutrition will be maintained 09/17/2019 1253 by Herma Ard, RN Outcome: Adequate for Discharge 09/17/2019 0809 by Herma Ard, RN Outcome: Progressing   Problem: Coping: Goal: Level of anxiety will decrease 09/17/2019 1253 by Herma Ard, RN Outcome: Adequate for Discharge 09/17/2019 0809 by Herma Ard, RN Outcome: Progressing   Problem: Elimination: Goal: Will not experience complications related to bowel motility 09/17/2019 1253 by Herma Ard, RN Outcome: Adequate for Discharge 09/17/2019 0809 by Herma Ard, RN Outcome: Progressing Goal: Will not experience complications related to urinary retention 09/17/2019 1253 by Herma Ard, RN Outcome: Adequate for Discharge 09/17/2019 0809 by Herma Ard, RN Outcome: Progressing   Problem: Pain Managment: Goal: General experience of comfort will improve 09/17/2019 1253 by Herma Ard, RN Outcome: Adequate for Discharge 09/17/2019 0809 by Herma Ard, RN Outcome: Progressing   Problem: Safety: Goal: Ability to remain free from injury will improve 09/17/2019 1253 by Herma Ard, RN Outcome: Adequate for Discharge 09/17/2019 0809 by Herma Ard,  RN Outcome: Progressing   Problem: Skin Integrity: Goal: Risk for impaired skin integrity will decrease 09/17/2019 1253 by Herma Ard, RN Outcome: Adequate for Discharge 09/17/2019 0809 by Herma Ard, RN Outcome: Progressing   Problem: Education: Goal: Ability to describe self-care measures that may prevent or decrease complications (Diabetes Survival Skills Education) will improve 09/17/2019 1253 by Herma Ard, RN Outcome: Adequate for Discharge 09/17/2019 0809 by Herma Ard,  RN Outcome: Progressing Goal: Individualized Educational Video(s) 09/17/2019 1253 by Herma Ard, RN Outcome: Adequate for Discharge 09/17/2019 0809 by Herma Ard, RN Outcome: Progressing   Problem: Coping: Goal: Ability to adjust to condition or change in health will improve 09/17/2019 1253 by Herma Ard, RN Outcome: Adequate for Discharge 09/17/2019 0809 by Herma Ard, RN Outcome: Progressing   Problem: Fluid Volume: Goal: Ability to maintain a balanced intake and output will improve 09/17/2019 1253 by Herma Ard, RN Outcome: Adequate for Discharge 09/17/2019 0809 by Herma Ard, RN Outcome: Progressing   Problem: Health Behavior/Discharge Planning: Goal: Ability to identify and utilize available resources and services will improve 09/17/2019 1253 by Herma Ard, RN Outcome: Adequate for Discharge 09/17/2019 0809 by Herma Ard, RN Outcome: Progressing Goal: Ability to manage health-related needs will improve 09/17/2019 1253 by Herma Ard, RN Outcome: Adequate for Discharge 09/17/2019 0809 by Herma Ard, RN Outcome: Progressing   Problem: Metabolic: Goal: Ability to maintain appropriate glucose levels will improve 09/17/2019 1253 by Herma Ard, RN Outcome: Adequate for Discharge 09/17/2019 0809 by Herma Ard, RN Outcome: Progressing   Problem: Nutritional: Goal: Maintenance of adequate nutrition will  improve 09/17/2019 1253 by Herma Ard, RN Outcome: Adequate for Discharge 09/17/2019 0809 by Herma Ard, RN Outcome: Progressing Goal: Progress toward achieving an optimal weight will improve 09/17/2019 1253 by Herma Ard, RN Outcome: Adequate for Discharge 09/17/2019 0809 by Herma Ard, RN Outcome: Progressing   Problem: Skin Integrity: Goal: Risk for impaired skin integrity will decrease 09/17/2019 1253 by Herma Ard, RN Outcome: Adequate for Discharge 09/17/2019 0809 by Herma Ard, RN Outcome: Progressing   Problem: Tissue Perfusion: Goal: Adequacy of tissue perfusion will improve 09/17/2019 1253 by Herma Ard, RN Outcome: Adequate for Discharge 09/17/2019 0809 by Herma Ard, RN Outcome: Progressing

## 2019-09-17 NOTE — Discharge Instructions (Signed)
Person Under Monitoring Name: Joy Phillips  Location: [place] ,[address] [place] [address]   Infection Prevention Recommendations for Individuals Confirmed to have, or Being Evaluated for, 2019 Novel Coronavirus (COVID-19) Infection Who Receive Care at Home  Individuals who are confirmed to have, or are being evaluated for, COVID-19 should follow the prevention steps below until a healthcare provider or local or state health department says they can return to normal activities.  Stay home except to get medical care You should restrict activities outside your home, except for getting medical care. Do not go to work, school, or public areas, and do not use public transportation or taxis.  Call ahead before visiting your doctor Before your medical appointment, call the healthcare provider and tell them that you have, or are being evaluated for, COVID-19 infection. This will help the healthcare providers office take steps to keep other people from getting infected. Ask your healthcare provider to call the local or state health department.  Monitor your symptoms Seek prompt medical attention if your illness is worsening (e.g., difficulty breathing). Before going to your medical appointment, call the healthcare provider and tell them that you have, or are being evaluated for, COVID-19 infection. Ask your healthcare provider to call the local or state health department.  Wear a facemask You should wear a facemask that covers your nose and mouth when you are in the same room with other people and when you visit a healthcare provider. People who live with or visit you should also wear a facemask while they are in the same room with you.  Separate yourself from other people in your home As much as possible, you should stay in a different room from other people in your home. Also, you should use a separate bathroom, if available.  Avoid sharing household items You  should not share dishes, drinking glasses, cups, eating utensils, towels, bedding, or other items with other people in your home. After using these items, you should wash them thoroughly with soap and water.  Cover your coughs and sneezes Cover your mouth and nose with a tissue when you cough or sneeze, or you can cough or sneeze into your sleeve. Throw used tissues in a lined trash can, and immediately wash your hands with soap and water for at least 20 seconds or use an alcohol-based hand rub.  Wash your [facility] your hands often and thoroughly with soap and water for at least 20 seconds. You can use an alcohol-based hand sanitizer if soap and water are not available and if your hands are not visibly dirty. Avoid touching your eyes, nose, and mouth with unwashed hands.   Prevention Steps for Caregivers and Household Members of Individuals Confirmed to have, or Being Evaluated for, COVID-19 Infection Being Cared for in the Home  If you live with, or provide care at home for, a person confirmed to have, or being evaluated for, COVID-19 infection please follow these guidelines to prevent infection:  Follow healthcare providers instructions Make sure that you understand and can help the patient follow any healthcare provider instructions for all care.  Provide for the patients basic needs You should help the patient with basic needs in the home and provide support for getting groceries, prescriptions, and other personal needs.  Monitor the patients symptoms If they are getting sicker, call his or her medical provider and tell them that the patient has, or is being evaluated for, COVID-19 infection. This will help the healthcare  providers office take steps to keep other people from getting infected. Ask the healthcare provider to call the local or state health department.  Limit the number of people who have contact with the patient  If possible, have only one caregiver for the  patient.  Other household members should stay in another home or place of residence. If this is not possible, they should stay  in another room, or be separated from the patient as much as possible. Use a separate bathroom, if available.  Restrict visitors who do not have an essential need to be in the home.  Keep older adults, very young children, and other sick people away from the patient Keep older adults, very young children, and those who have compromised immune systems or chronic health conditions away from the patient. This includes people with chronic heart, lung, or kidney conditions, diabetes, and cancer.  Ensure good ventilation Make sure that shared spaces in the home have good air flow, such as from an air conditioner or an opened window, weather permitting.  Wash your hands often  Wash your hands often and thoroughly with soap and water for at least 20 seconds. You can use an alcohol based hand sanitizer if soap and water are not available and if your hands are not visibly dirty.  Avoid touching your eyes, nose, and mouth with unwashed hands.  Use disposable paper towels to dry your hands. If not available, use dedicated cloth towels and replace them when they become wet.  Wear a facemask and gloves  Wear a disposable facemask at all times in the room and gloves when you touch or have contact with the patients blood, body fluids, and/or secretions or excretions, such as sweat, saliva, sputum, nasal mucus, vomit, urine, or feces.  Ensure the mask fits over your nose and mouth tightly, and do not touch it during use.  Throw out disposable facemasks and gloves after using them. Do not reuse.  Wash your hands immediately after removing your facemask and gloves.  If your personal clothing becomes contaminated, carefully remove clothing and launder. Wash your hands after handling contaminated clothing.  Place all used disposable facemasks, gloves, and other waste in a lined  container before disposing them with other household waste.  Remove gloves and wash your hands immediately after handling these items.  Do not share dishes, glasses, or other household items with the patient  Avoid sharing household items. You should not share dishes, drinking glasses, cups, eating utensils, towels, bedding, or other items with a patient who is confirmed to have, or being evaluated for, COVID-19 infection.  After the person uses these items, you should wash them thoroughly with soap and water.  Wash laundry thoroughly  Immediately remove and wash clothes or bedding that have blood, body fluids, and/or secretions or excretions, such as sweat, saliva, sputum, nasal mucus, vomit, urine, or feces, on them.  Wear gloves when handling laundry from the patient.  Read and follow directions on labels of laundry or clothing items and detergent. In general, wash and dry with the warmest temperatures recommended on the label.  Clean all areas the individual has used often  Clean all touchable surfaces, such as counters, tabletops, doorknobs, bathroom fixtures, toilets, phones, keyboards, tablets, and bedside tables, every day. Also, clean any surfaces that may have blood, body fluids, and/or secretions or excretions on them.  Wear gloves when cleaning surfaces the patient has come in contact with.  Use a diluted bleach solution (e.g., dilute bleach with  1 part bleach and 10 parts water) or a household disinfectant with a label that says EPA-registered for coronaviruses. To make a bleach solution at home, add 1 tablespoon of bleach to 1 quart (4 cups) of water. For a larger supply, add  cup of bleach to 1 gallon (16 cups) of water.  Read labels of cleaning products and follow recommendations provided on product labels. Labels contain instructions for safe and effective use of the cleaning product including precautions you should take when applying the product, such as wearing gloves or  eye protection and making sure you have good ventilation during use of the product.  Remove gloves and wash hands immediately after cleaning.  Monitor yourself for signs and symptoms of illness Caregivers and household members are considered close contacts, should monitor their health, and will be asked to limit movement outside of the home to the extent possible. Follow the monitoring steps for close contacts listed on the symptom monitoring form.   ? If you have additional questions, contact your local health department or call the epidemiologist on call at [phone] (available 24/7). ? This guidance is subject to change. For the most up-to-date guidance from [facility], please refer to their website: [email or web address]

## 2019-09-17 NOTE — Discharge Summary (Addendum)
PATIENT DETAILS Name: Joy Phillips Age: 48 y.o. Sex: female Date of Birth: 03/19/1971 MRN: [id number]. Admitting Physician: Debbe Odea, MD [id number], Erline Levine, MD  Admit Date: 09/13/2019 Discharge date: 09/17/2019  Recommendations for Outpatient Follow-up:  1. Follow up with PCP in 1-2 weeks 2. Please obtain BMP/CBC in one week 3. Repeat CXR in 4-6 weeks 4. Needs transabdominal/transvaginal ultrasound in 6 to 12 weeks.   Admitted From:  Home  Disposition: [place]: yes  Equipment/Devices: None  Discharge Condition: Stable  CODE STATUS: FULL CODE  Diet recommendation:  Diet Order            Diet - low sodium heart healthy        Diet Carb Modified        Diet heart healthy/carb modified Room service appropriate? Yes; Fluid consistency: Thin  Diet effective now               Brief Summary: See H&P, Labs, Consult and Test reports for all details in brief,Patient is a 48 y.o. female with PMHx of DM-2 presented with a 2-3-day history of diarrhea, cough and dyspnea, she was found to have COVID-19 pneumonia and admitted to the hospitalist service.  See below for further details   Brief Hospital Course: Sepsis secondary to COVID-19 PNA: Sepsis physiology has resolved-she remains on room air-Treated with steroids and remdesivir.  COVID-19 Medications: Steroids: 11/29>> Remdesivir: 11/29>>12/3 Actemra: Not indicated Convalescent Plasma: Not given  Gastroenteritis secondary to COVID-19: Diarrhea resolved-as needed Imodium.   Uncontrolled DM-2 with hyperglycemia (A1c 14.3 on 11/29): poor long term control-CBGs controlled with Levemir and SSI, will increase Levemir to 22 units on discharge, she is willing to try Metformin on discharge as well. She will follow up with her PCP for further optimization.  Right ovarian cyst: Seen incidentally on CT-pelvic ultrasound shows complicated cystic lesions in both the ovaries-recommendations from radiology are  for short interval follow-up transabdominal/transvaginal ultrasound in 6 to 12 weeks.  Asymptomatic bacteriuria: Does not have any symptoms of UTI-no need for antibiotics.    Obesity: Estimated body mass index is 32.51 kg/m as calculated from the following:   Height as of this encounter: _0  (1.702 m).   Weight as of this encounter: 94.2 kg.    Procedures/Studies: None  Discharge Diagnoses:  Principal Problem:   Pneumonia due to COVID-19 virus Active Problems:   DM (diabetes mellitus), type 2 ([place])   Abdominal pain   Ovarian cyst   Discharge Instructions:  Activity:  As tolerated   Discharge Instructions    Call MD for:  difficulty breathing, headache or visual disturbances   Complete by: As directed    Call MD for:  extreme fatigue   Complete by: As directed    Call MD for:  persistant dizziness or light-headedness   Complete by: As directed    Diet - low sodium heart healthy   Complete by: As directed    Diet Carb Modified   Complete by: As directed    Discharge instructions   Complete by: As directed    Follow with Primary MD  Gwendalyn Ege, MD in 1-2 weeks  Please get a complete blood count and chemistry panel checked by your Primary MD at your next visit, and again as instructed by your Primary MD.  Get Medicines reviewed and adjusted: Please take all your medications with you for your next visit with your Primary MD  Laboratory/radiological data: Please request your Primary MD to go over all  hospital tests and procedure/radiological results at the follow up, please ask your Primary MD to get all Hospital records sent to his/her office.  In some cases, they will be blood work, cultures and biopsy results pending at the time of your discharge. Please request that your primary care M.D. follows up on these results.  Also Note the following: If you experience worsening of your admission symptoms, develop shortness of breath, life threatening emergency,  suicidal or homicidal thoughts you must seek medical attention immediately by calling 911 or calling your MD immediately  if symptoms less severe.  You must read complete instructions/literature along with all the possible adverse reactions/side effects for all the Medicines you take and that have been prescribed to you. Take any new Medicines after you have completely understood and accpet all the possible adverse reactions/side effects.   Do not drive when taking Pain medications or sleeping medications (Benzodaizepines)  Do not take more than prescribed Pain, Sleep and Anxiety Medications. It is not advisable to combine anxiety,sleep and pain medications without talking with your primary care practitioner  Special Instructions: If you have smoked or chewed Tobacco  in the last 2 yrs please stop smoking, stop any regular Alcohol  and or any Recreational drug use.  Wear Seat belts while driving.  Please note: You were cared for by a hospitalist during your hospital stay. Once you are discharged, your primary care physician will handle any further medical issues. Please note that NO REFILLS for any discharge medications will be authorized once you are discharged, as it is imperative that you return to your primary care physician (or establish a relationship with a primary care physician if you do not have one) for your post hospital discharge needs so that they can reassess your need for medications and monitor your lab values.         Person Under Monitoring Name: Joy Phillips  Location: [place] ,[address] [place] [address]   Infection Prevention Recommendations for Individuals Confirmed to have, or Being Evaluated for, 2019 Novel Coronavirus (COVID-19) Infection Who Receive Care at Home  Individuals who are confirmed to have, or are being evaluated for, COVID-19 should follow the prevention steps below until a healthcare provider or local or state health department  says they can return to normal activities.  Stay home except to get medical care You should restrict activities outside your home, except for getting medical care. Do not go to work, school, or public areas, and do not use public transportation or taxis.  Call ahead before visiting your doctor Before your medical appointment, call the healthcare provider and tell them that you have, or are being evaluated for, COVID-19 infection. This will help the healthcare provider's office take steps to keep other people from getting infected. Ask your healthcare provider to call the local or state health department.  Monitor your symptoms Seek prompt medical attention if your illness is worsening (e.g., difficulty breathing). Before going to your medical appointment, call the healthcare provider and tell them that you have, or are being evaluated for, COVID-19 infection. Ask your healthcare provider to call the local or state health department.  Wear a facemask You should wear a facemask that covers your nose and mouth when you are in the same room with other people and when you visit a healthcare provider. People who live with or visit you should also wear a facemask while they are in the same room with you.  Separate yourself from other  people in your home As much as possible, you should stay in a different room from other people in your home. Also, you should use a separate bathroom, if available.  Avoid sharing household items You should not share dishes, drinking glasses, cups, eating utensils, towels, bedding, or other items with other people in your home. After using these items, you should wash them thoroughly with soap and water.  Cover your coughs and sneezes Cover your mouth and nose with a tissue when you cough or sneeze, or you can cough or sneeze into your sleeve. Throw used tissues in a lined trash can, and immediately wash your hands with soap and water for at least 20 seconds or  use an alcohol-based hand rub.  Wash your [facility] your hands often and thoroughly with soap and water for at least 20 seconds. You can use an alcohol-based hand sanitizer if soap and water are not available and if your hands are not visibly dirty. Avoid touching your eyes, nose, and mouth with unwashed hands.   Prevention Steps for Caregivers and Household Members of Individuals Confirmed to have, or Being Evaluated for, COVID-19 Infection Being Cared for in the Home  If you live with, or provide care at home for, a person confirmed to have, or being evaluated for, COVID-19 infection please follow these guidelines to prevent infection:  Follow healthcare provider's instructions Make sure that you understand and can help the patient follow any healthcare provider instructions for all care.  Provide for the patient's basic needs You should help the patient with basic needs in the home and provide support for getting groceries, prescriptions, and other personal needs.  Monitor the patient's symptoms If they are getting sicker, call his or her medical provider and tell them that the patient has, or is being evaluated for, COVID-19 infection. This will help the healthcare provider's office take steps to keep other people from getting infected. Ask the healthcare provider to call the local or state health department.  Limit the number of people who have contact with the patient If possible, have only one caregiver for the patient. Other household members should stay in another home or place of residence. If this is not possible, they should stay in another room, or be separated from the patient as much as possible. Use a separate bathroom, if available. Restrict visitors who do not have an essential need to be in the home.  Keep older adults, very young children, and other sick people away from the patient Keep older adults, very young children, and those who have compromised immune  systems or chronic health conditions away from the patient. This includes people with chronic heart, lung, or kidney conditions, diabetes, and cancer.  Ensure good ventilation Make sure that shared spaces in the home have good air flow, such as from an air conditioner or an opened window, weather permitting.  Wash your hands often Wash your hands often and thoroughly with soap and water for at least 20 seconds. You can use an alcohol based hand sanitizer if soap and water are not available and if your hands are not visibly dirty. Avoid touching your eyes, nose, and mouth with unwashed hands. Use disposable paper towels to dry your hands. If not available, use dedicated cloth towels and replace them when they become wet.  Wear a facemask and gloves Wear a disposable facemask at all times in the room and gloves when you touch or have contact with the patient's blood, body fluids, and/or  secretions or excretions, such as sweat, saliva, sputum, nasal mucus, vomit, urine, or feces.  Ensure the mask fits over your nose and mouth tightly, and do not touch it during use. Throw out disposable facemasks and gloves after using them. Do not reuse. Wash your hands immediately after removing your facemask and gloves. If your personal clothing becomes contaminated, carefully remove clothing and launder. Wash your hands after handling contaminated clothing. Place all used disposable facemasks, gloves, and other waste in a lined container before disposing them with other household waste. Remove gloves and wash your hands immediately after handling these items.  Do not share dishes, glasses, or other household items with the patient Avoid sharing household items. You should not share dishes, drinking glasses, cups, eating utensils, towels, bedding, or other items with a patient who is confirmed to have, or being evaluated for, COVID-19 infection. After the person uses these items, you should wash them thoroughly  with soap and water.  Wash laundry thoroughly Immediately remove and wash clothes or bedding that have blood, body fluids, and/or secretions or excretions, such as sweat, saliva, sputum, nasal mucus, vomit, urine, or feces, on them. Wear gloves when handling laundry from the patient. Read and follow directions on labels of laundry or clothing items and detergent. In general, wash and dry with the warmest temperatures recommended on the label.  Clean all areas the individual has used often Clean all touchable surfaces, such as counters, tabletops, doorknobs, bathroom fixtures, toilets, phones, keyboards, tablets, and bedside tables, every day. Also, clean any surfaces that may have blood, body fluids, and/or secretions or excretions on them. Wear gloves when cleaning surfaces the patient has come in contact with. Use a diluted bleach solution (e.g., dilute bleach with 1 part bleach and 10 parts water) or a household disinfectant with a label that says EPA-registered for coronaviruses. To make a bleach solution at home, add 1 tablespoon of bleach to 1 quart (4 cups) of water. For a larger supply, add  cup of bleach to 1 gallon (16 cups) of water. Read labels of cleaning products and follow recommendations provided on product labels. Labels contain instructions for safe and effective use of the cleaning product including precautions you should take when applying the product, such as wearing gloves or eye protection and making sure you have good ventilation during use of the product. Remove gloves and wash hands immediately after cleaning.  Monitor yourself for signs and symptoms of illness Caregivers and household members are considered close contacts, should monitor their health, and will be asked to limit movement outside of the home to the extent possible. Follow the monitoring steps for close contacts listed on the symptom monitoring form.   ? If you have additional questions, contact your  local health department or call the epidemiologist on call at [phone] (available 24/7). ? This guidance is subject to change. For the most up-to-date guidance from [facility], please refer to their website: YouBlogs.pl   1.Isolation for 3 weeks from 09/13/19  2. You need a ultrasound of your ovaries in 6-12 weeks-to evaluate ovarian cysts, please talk to your primary care about this   Increase activity slowly   Complete by: As directed      Allergies as of 09/17/2019   No Known Allergies     Medication List    TAKE these medications   Albuterol Sulfate 108 (90 Base) MCG/ACT Aepb Inhale 1-2 puffs into the lungs 4 (four) times daily as needed (for SOB).   Ascorbic Acid  500 MG Caps Take 2 tablets by mouth daily.   blood glucose meter kit and supplies Dispense based on patient and insurance preference. Use up to four times daily as directed. (FOR ICD-10 E10.9, E11.9).   Levemir FlexTouch 100 UNIT/ML Pen Generic drug: Insulin Detemir Inject 22 Units into the skin at bedtime. What changed: how much to take   metFORMIN 500 MG tablet Commonly known as: Glucophage Take 1 tablet (500 mg total) by mouth 2 (two) times daily with a meal.   predniSONE 10 MG tablet Commonly known as: DELTASONE Take 40 mg daily for 1 day, 30 mg daily for 1 day, 20 mg daily for 1 days,10 mg daily for 1 day, then stop      Follow-up Information    Gwendalyn Ege, MD. Schedule an appointment as soon as possible for a visit in 1 week(s).   Specialty: Obstetrics and Gynecology Contact information: [place] [id number] [phone]          No Known Allergies   Consultations:   None   Other Procedures/Studies: Ct Angio Chest Pe W And/or Wo Contrast  Result Date: 09/13/2019 CLINICAL DATA:  Patient with history of COVID-19. Abdominal pain. Evaluate for pulmonary embolus. EXAM: CT CHEST, ABDOMEN, AND PELVIS WITH  CONTRAST TECHNIQUE: Multidetector CT imaging of the chest, abdomen and pelvis was performed following the standard protocol during bolus administration of intravenous contrast. CONTRAST:  177m OMNIPAQUE IOHEXOL 350 MG/ML SOLN COMPARISON:  CT abdomen pelvis 12/15/2009. FINDINGS: CT CHEST FINDINGS Cardiovascular: Normal heart size. Aorta main pulmonary artery normal in caliber. Adequate opacification of the pulmonary arterial system. Motion artifact limits evaluation. Within the above limitation, no intraluminal filling defect identified to suggest acute pulmonary embolus. Mediastinum/Nodes: No enlarged axillary, mediastinal or hilar lymphadenopathy. Normal appearance of the esophagus. Lungs/Pleura: Central airways are patent. Sharply marginated patchy areas of ground-glass attenuation are demonstrated within the lungs bilaterally. No pleural effusion or pneumothorax. Musculoskeletal: No aggressive or acute appearing osseous lesions. CT ABDOMEN PELVIS FINDINGS Hepatobiliary: The liver is normal in size and contour. Gallbladder is unremarkable. No intrahepatic or extrahepatic biliary ductal dilatation. Pancreas: Unremarkable Spleen: Unremarkable Adrenals/Urinary Tract: Normal adrenal glands. Kidneys enhance symmetrically with contrast. Postsurgical changes involving the midpole of the right kidney. Subcentimeter too small to characterize low-attenuation lesion superior pole right kidney. Urinary bladder is unremarkable. Stomach/Bowel: There are a few fluid-filled loops of small bowel (2 cm) within the left hemiabdomen (image 45; series 3). No abnormal bowel wall thickening. No free fluid or free intraperitoneal air. Normal morphology of the stomach. Vascular/Lymphatic: Normal caliber abdominal aorta. No retroperitoneal lymphadenopathy. Reproductive: Status post hysterectomy. The right ovary is enlarged measuring up to 5.7 x 3.8 cm and contains multiple cystic structures. Prominent follicle within the left ovary.  Other: None. Musculoskeletal: Lumbar spine degenerative changes. No aggressive or acute appearing osseous lesions. IMPRESSION: 1. No evidence for acute pulmonary embolus. 2. Bilateral patchy areas of ground-glass and consolidation most compatible with history of COVID-19. 3. Few fluid-filled loops of bowel within the left hemiabdomen are nonspecific however may be secondary to ileus. 4. The right ovary is enlarged and contains multiple cystic structures. This needs further evaluation pelvic ultrasound. Electronically Signed   By: DLovey NewcomerM.D.   On: 09/13/2019 16:43   [place]elvis Complete  Result Date: 09/16/2019 CLINICAL DATA:  Ovarian cyst EXAM: TRANSABDOMINAL ULTRASOUND OF PELVIS TECHNIQUE: Transabdominal ultrasound examination of the pelvis was performed including evaluation of the uterus, ovaries, adnexal regions, and pelvic cul-de-sac. COMPARISON:  CT abdomen and pelvis 09/13/2019 FINDINGS: Uterus Surgically absent Endometrium N/A Right ovary Measurements: 7.7 x 3.33.4 cm = volume: 46.2 mL. Complex appearing RIGHT ovary containing 3 small cysts, largest 2.7 x 1.3 x 1.8 cm. All 3 lesions contain scattered low level internal echogenicity. No definite septations or mural nodularity. No solid mass. Left ovary Measurements: 3.6 x 1.9 x 3.3 cm = volume: 11.7 mL. Hypoechoic nodule 1.1 x 2.3 x 1.4 cm, containing diffuse low level internal echogenicity. Other findings: No free pelvic fluid. No other adnexal masses. Visualized bladder unremarkable. IMPRESSION: Surgical absence of uterus. Complicated cystic lesions in both ovaries, with 3 mildly complicated cysts within the RIGHT ovary up to 2.7 cm in size and a single complex hypoechoic nodule 2.3 cm greatest size within LEFT ovary question hemorrhagic cyst. No transvaginal imaging was ordered, which likely would provide better characterization. Short-interval follow up transabdominal and transvaginal ultrasound in 6-12 weeks is recommended to reassess these  lesions for character and stability versus evolution. Electronically Signed   By: Lavonia Dana M.D.   On: 09/16/2019 13:16   Ct Abdomen Pelvis W Contrast  Result Date: 09/13/2019 CLINICAL DATA:  Patient with history of COVID-19. Abdominal pain. Evaluate for pulmonary embolus. EXAM: CT CHEST, ABDOMEN, AND PELVIS WITH CONTRAST TECHNIQUE: Multidetector CT imaging of the chest, abdomen and pelvis was performed following the standard protocol during bolus administration of intravenous contrast. CONTRAST:  180m OMNIPAQUE IOHEXOL 350 MG/ML SOLN COMPARISON:  CT abdomen pelvis 12/15/2009. FINDINGS: CT CHEST FINDINGS Cardiovascular: Normal heart size. Aorta main pulmonary artery normal in caliber. Adequate opacification of the pulmonary arterial system. Motion artifact limits evaluation. Within the above limitation, no intraluminal filling defect identified to suggest acute pulmonary embolus. Mediastinum/Nodes: No enlarged axillary, mediastinal or hilar lymphadenopathy. Normal appearance of the esophagus. Lungs/Pleura: Central airways are patent. Sharply marginated patchy areas of ground-glass attenuation are demonstrated within the lungs bilaterally. No pleural effusion or pneumothorax. Musculoskeletal: No aggressive or acute appearing osseous lesions. CT ABDOMEN PELVIS FINDINGS Hepatobiliary: The liver is normal in size and contour. Gallbladder is unremarkable. No intrahepatic or extrahepatic biliary ductal dilatation. Pancreas: Unremarkable Spleen: Unremarkable Adrenals/Urinary Tract: Normal adrenal glands. Kidneys enhance symmetrically with contrast. Postsurgical changes involving the midpole of the right kidney. Subcentimeter too small to characterize low-attenuation lesion superior pole right kidney. Urinary bladder is unremarkable. Stomach/Bowel: There are a few fluid-filled loops of small bowel (2 cm) within the left hemiabdomen (image 45; series 3). No abnormal bowel wall thickening. No free fluid or free  intraperitoneal air. Normal morphology of the stomach. Vascular/Lymphatic: Normal caliber abdominal aorta. No retroperitoneal lymphadenopathy. Reproductive: Status post hysterectomy. The right ovary is enlarged measuring up to 5.7 x 3.8 cm and contains multiple cystic structures. Prominent follicle within the left ovary. Other: None. Musculoskeletal: Lumbar spine degenerative changes. No aggressive or acute appearing osseous lesions. IMPRESSION: 1. No evidence for acute pulmonary embolus. 2. Bilateral patchy areas of ground-glass and consolidation most compatible with history of COVID-19. 3. Few fluid-filled loops of bowel within the left hemiabdomen are nonspecific however may be secondary to ileus. 4. The right ovary is enlarged and contains multiple cystic structures. This needs further evaluation pelvic ultrasound. Electronically Signed   By: DLovey NewcomerM.D.   On: 09/13/2019 16:43   Dg Chest Port 1 View  Result Date: 09/13/2019 CLINICAL DATA:  Abdominal pain. Sepsis. EXAM: PORTABLE CHEST 1 VIEW COMPARISON:  None. FINDINGS: Bilateral pulmonary infiltrates are identified. No pneumothorax. The heart, hila, mediastinum, lungs, and pleura are otherwise normal.  IMPRESSION: Bilateral pulmonary infiltrates are identified. Atypical infections should be considered. Electronically Signed   By: Dorise Bullion III M.D   On: 09/13/2019 13:32     TODAY-DAY OF DISCHARGE:  Subjective:   Joy Phillips today has no headache,no chest abdominal pain,no new weakness tingling or numbness, feels much better wants to go home today.   Objective:   Blood pressure 118/81, pulse 70, temperature 98.1 F (36.7 C), temperature source Oral, resp. rate 18, height _0  (1.702 m), weight 94.2 kg, SpO2 92 %.  Intake/Output Summary (Last 24 hours) at 09/17/2019 0841 Last data filed at 09/17/2019 0500 Gross per 24 hour  Intake 720 ml  Output 150 ml  Net 570 ml   Filed Weights   09/13/19 1619 09/13/19 2356  Weight: 92.1 kg  94.2 kg    Exam: Awake Alert, Oriented *3, No new F.N deficits, Normal affect Odem.AT,PERRAL Supple Neck,No JVD, No cervical lymphadenopathy appriciated.  Symmetrical Chest wall movement, Good air movement bilaterally, CTAB RRR,No Gallops,Rubs or new Murmurs, No Parasternal Heave +ve B.Sounds, Abd Soft, Non tender, No organomegaly appriciated, No rebound -guarding or rigidity. No Cyanosis, Clubbing or edema, No new Rash or bruise   PERTINENT RADIOLOGIC STUDIES: Ct Angio Chest Pe W And/or Wo Contrast  Result Date: 09/13/2019 CLINICAL DATA:  Patient with history of COVID-19. Abdominal pain. Evaluate for pulmonary embolus. EXAM: CT CHEST, ABDOMEN, AND PELVIS WITH CONTRAST TECHNIQUE: Multidetector CT imaging of the chest, abdomen and pelvis was performed following the standard protocol during bolus administration of intravenous contrast. CONTRAST:  168m OMNIPAQUE IOHEXOL 350 MG/ML SOLN COMPARISON:  CT abdomen pelvis 12/15/2009. FINDINGS: CT CHEST FINDINGS Cardiovascular: Normal heart size. Aorta main pulmonary artery normal in caliber. Adequate opacification of the pulmonary arterial system. Motion artifact limits evaluation. Within the above limitation, no intraluminal filling defect identified to suggest acute pulmonary embolus. Mediastinum/Nodes: No enlarged axillary, mediastinal or hilar lymphadenopathy. Normal appearance of the esophagus. Lungs/Pleura: Central airways are patent. Sharply marginated patchy areas of ground-glass attenuation are demonstrated within the lungs bilaterally. No pleural effusion or pneumothorax. Musculoskeletal: No aggressive or acute appearing osseous lesions. CT ABDOMEN PELVIS FINDINGS Hepatobiliary: The liver is normal in size and contour. Gallbladder is unremarkable. No intrahepatic or extrahepatic biliary ductal dilatation. Pancreas: Unremarkable Spleen: Unremarkable Adrenals/Urinary Tract: Normal adrenal glands. Kidneys enhance symmetrically with contrast.  Postsurgical changes involving the midpole of the right kidney. Subcentimeter too small to characterize low-attenuation lesion superior pole right kidney. Urinary bladder is unremarkable. Stomach/Bowel: There are a few fluid-filled loops of small bowel (2 cm) within the left hemiabdomen (image 45; series 3). No abnormal bowel wall thickening. No free fluid or free intraperitoneal air. Normal morphology of the stomach. Vascular/Lymphatic: Normal caliber abdominal aorta. No retroperitoneal lymphadenopathy. Reproductive: Status post hysterectomy. The right ovary is enlarged measuring up to 5.7 x 3.8 cm and contains multiple cystic structures. Prominent follicle within the left ovary. Other: None. Musculoskeletal: Lumbar spine degenerative changes. No aggressive or acute appearing osseous lesions. IMPRESSION: 1. No evidence for acute pulmonary embolus. 2. Bilateral patchy areas of ground-glass and consolidation most compatible with history of COVID-19. 3. Few fluid-filled loops of bowel within the left hemiabdomen are nonspecific however may be secondary to ileus. 4. The right ovary is enlarged and contains multiple cystic structures. This needs further evaluation pelvic ultrasound. Electronically Signed   By: DLovey NewcomerM.D.   On: 09/13/2019 16:43   [place]elvis Complete  Result Date: 09/16/2019 CLINICAL DATA:  Ovarian cyst EXAM: TRANSABDOMINAL ULTRASOUND OF PELVIS  TECHNIQUE: Transabdominal ultrasound examination of the pelvis was performed including evaluation of the uterus, ovaries, adnexal regions, and pelvic cul-de-sac. COMPARISON:  CT abdomen and pelvis 09/13/2019 FINDINGS: Uterus Surgically absent Endometrium N/A Right ovary Measurements: 7.7 x 3.33.4 cm = volume: 46.2 mL. Complex appearing RIGHT ovary containing 3 small cysts, largest 2.7 x 1.3 x 1.8 cm. All 3 lesions contain scattered low level internal echogenicity. No definite septations or mural nodularity. No solid mass. Left ovary Measurements: 3.6 x 1.9  x 3.3 cm = volume: 11.7 mL. Hypoechoic nodule 1.1 x 2.3 x 1.4 cm, containing diffuse low level internal echogenicity. Other findings: No free pelvic fluid. No other adnexal masses. Visualized bladder unremarkable. IMPRESSION: Surgical absence of uterus. Complicated cystic lesions in both ovaries, with 3 mildly complicated cysts within the RIGHT ovary up to 2.7 cm in size and a single complex hypoechoic nodule 2.3 cm greatest size within LEFT ovary question hemorrhagic cyst. No transvaginal imaging was ordered, which likely would provide better characterization. Short-interval follow up transabdominal and transvaginal ultrasound in 6-12 weeks is recommended to reassess these lesions for character and stability versus evolution. Electronically Signed   By: Lavonia Dana M.D.   On: 09/16/2019 13:16   Ct Abdomen Pelvis W Contrast  Result Date: 09/13/2019 CLINICAL DATA:  Patient with history of COVID-19. Abdominal pain. Evaluate for pulmonary embolus. EXAM: CT CHEST, ABDOMEN, AND PELVIS WITH CONTRAST TECHNIQUE: Multidetector CT imaging of the chest, abdomen and pelvis was performed following the standard protocol during bolus administration of intravenous contrast. CONTRAST:  171m OMNIPAQUE IOHEXOL 350 MG/ML SOLN COMPARISON:  CT abdomen pelvis 12/15/2009. FINDINGS: CT CHEST FINDINGS Cardiovascular: Normal heart size. Aorta main pulmonary artery normal in caliber. Adequate opacification of the pulmonary arterial system. Motion artifact limits evaluation. Within the above limitation, no intraluminal filling defect identified to suggest acute pulmonary embolus. Mediastinum/Nodes: No enlarged axillary, mediastinal or hilar lymphadenopathy. Normal appearance of the esophagus. Lungs/Pleura: Central airways are patent. Sharply marginated patchy areas of ground-glass attenuation are demonstrated within the lungs bilaterally. No pleural effusion or pneumothorax. Musculoskeletal: No aggressive or acute appearing osseous  lesions. CT ABDOMEN PELVIS FINDINGS Hepatobiliary: The liver is normal in size and contour. Gallbladder is unremarkable. No intrahepatic or extrahepatic biliary ductal dilatation. Pancreas: Unremarkable Spleen: Unremarkable Adrenals/Urinary Tract: Normal adrenal glands. Kidneys enhance symmetrically with contrast. Postsurgical changes involving the midpole of the right kidney. Subcentimeter too small to characterize low-attenuation lesion superior pole right kidney. Urinary bladder is unremarkable. Stomach/Bowel: There are a few fluid-filled loops of small bowel (2 cm) within the left hemiabdomen (image 45; series 3). No abnormal bowel wall thickening. No free fluid or free intraperitoneal air. Normal morphology of the stomach. Vascular/Lymphatic: Normal caliber abdominal aorta. No retroperitoneal lymphadenopathy. Reproductive: Status post hysterectomy. The right ovary is enlarged measuring up to 5.7 x 3.8 cm and contains multiple cystic structures. Prominent follicle within the left ovary. Other: None. Musculoskeletal: Lumbar spine degenerative changes. No aggressive or acute appearing osseous lesions. IMPRESSION: 1. No evidence for acute pulmonary embolus. 2. Bilateral patchy areas of ground-glass and consolidation most compatible with history of COVID-19. 3. Few fluid-filled loops of bowel within the left hemiabdomen are nonspecific however may be secondary to ileus. 4. The right ovary is enlarged and contains multiple cystic structures. This needs further evaluation pelvic ultrasound. Electronically Signed   By: DLovey NewcomerM.D.   On: 09/13/2019 16:43   Dg Chest Port 1 View  Result Date: 09/13/2019 CLINICAL DATA:  Abdominal pain. Sepsis. EXAM: PORTABLE CHEST 1  VIEW COMPARISON:  None. FINDINGS: Bilateral pulmonary infiltrates are identified. No pneumothorax. The heart, hila, mediastinum, lungs, and pleura are otherwise normal. IMPRESSION: Bilateral pulmonary infiltrates are identified. Atypical infections  should be considered. Electronically Signed   By: Dorise Bullion III M.D   On: 09/13/2019 13:32     PERTINENT LAB RESULTS: CBC: Recent Labs    09/16/19 0300 09/17/19 0328  WBC 8.9 7.4  HGB 13.4 13.5  HCT 40.4 40.4  PLT 161 172   CMET CMP     Component Value Date/Time   NA 138 09/17/2019 0328   K 3.2 (L) 09/17/2019 0328   CL 103 09/17/2019 0328   CO2 25 09/17/2019 0328   GLUCOSE 164 (H) 09/17/2019 0328   BUN 16 09/17/2019 0328   CREATININE 0.79 09/17/2019 0328   CALCIUM 8.6 (L) 09/17/2019 0328   PROT 6.3 (L) 09/17/2019 0328   ALBUMIN 2.9 (L) 09/17/2019 0328   AST 36 09/17/2019 0328   ALT 34 09/17/2019 0328   ALKPHOS 50 09/17/2019 0328   BILITOT 0.8 09/17/2019 0328   GFRNONAA >60 09/17/2019 0328   GFRAA >60 09/17/2019 0328    GFR Estimated Creatinine Clearance: 102.4 mL/min (by C-G formula based on SCr of 0.79 mg/dL). No results for input(s): LIPASE, AMYLASE in the last 72 hours. No results for input(s): CKTOTAL, CKMB, CKMBINDEX, TROPONINI in the last 72 hours. Invalid input(s): POCBNP Recent Labs    09/16/19 0300 09/17/19 0328  DDIMER 0.74* 0.52*   No results for input(s): HGBA1C in the last 72 hours. No results for input(s): CHOL, HDL, LDLCALC, TRIG, CHOLHDL, LDLDIRECT in the last 72 hours. No results for input(s): TSH, T4TOTAL, T3FREE, THYROIDAB in the last 72 hours.  Invalid input(s): FREET3 Recent Labs    09/16/19 0300 09/17/19 0328  FERRITIN 473* 451*   Coags: No results for input(s): INR in the last 72 hours.  Invalid input(s): PT Microbiology: Recent Results (from the past 240 hour(s))  Blood Culture (routine x 2)     Status: None (Preliminary result)   Collection Time: 09/13/19  1:55 PM   Specimen: BLOOD  Result Value Ref Range Status   Specimen Description   Final    BLOOD RIGHT ANTECUBITAL Performed at [facility], [place] [address] [place], [place] [address]    Special Requests   Final    BOTTLES DRAWN AEROBIC AND ANAEROBIC  Blood Culture adequate volume Performed at [place] [address], [place], [place] [address]    Culture   Final    NO GROWTH 4 DAYS Performed at [facility], [place] [address] [place], [place] [address]    Report Status PENDING  Incomplete  Blood Culture (routine x 2)     Status: None (Preliminary result)   Collection Time: 09/13/19  1:55 PM   Specimen: BLOOD  Result Value Ref Range Status   Specimen Description   Final    BLOOD LEFT ANTECUBITAL Performed at [facility], [place] [address], [place], [place] [address]    Special Requests   Final    BOTTLES DRAWN AEROBIC AND ANAEROBIC Blood Culture adequate volume Performed at [facility] [facility] [address] [place], [place] [address]    Culture   Final    NO GROWTH 4 DAYS Performed at [facility], [place] [address], [place], [place] [address]    Report Status PENDING  Incomplete  Urine culture     Status: Abnormal   Collection Time:  09/13/19  1:55 PM   Specimen: In/Out Cath Urine  Result Value Ref Range Status   Specimen Description   Final    IN/OUT CATH URINE Performed at [facility], [place] [address], [place], [place] [address]    Special Requests   Final    NONE Performed at [facility], [place] [address] [place], [place] [address]    Culture MULTIPLE SPECIES PRESENT, SUGGEST RECOLLECTION (A)  Final   Report Status 09/14/2019 FINAL  Final  Respiratory Panel by PCR     Status: None   Collection Time: 09/13/19  5:15 PM   Specimen: Respiratory  Result Value Ref Range Status   Adenovirus NOT DETECTED NOT DETECTED Final   Coronavirus 229E NOT DETECTED NOT DETECTED Final    Comment: (NOTE) The Coronavirus on the Respiratory Panel, DOES NOT test for the novel  Coronavirus (2019 nCoV)    Coronavirus HKU1 NOT DETECTED NOT DETECTED Final   Coronavirus NL63 NOT DETECTED NOT DETECTED Final   Coronavirus OC43 NOT DETECTED NOT DETECTED  Final   Metapneumovirus NOT DETECTED NOT DETECTED Final   Rhinovirus / Enterovirus NOT DETECTED NOT DETECTED Final   Influenza A NOT DETECTED NOT DETECTED Final   Influenza B NOT DETECTED NOT DETECTED Final   Parainfluenza Virus 1 NOT DETECTED NOT DETECTED Final   Parainfluenza Virus 2 NOT DETECTED NOT DETECTED Final   Parainfluenza Virus 3 NOT DETECTED NOT DETECTED Final   Parainfluenza Virus 4 NOT DETECTED NOT DETECTED Final   Respiratory Syncytial Virus NOT DETECTED NOT DETECTED Final   Bordetella pertussis NOT DETECTED NOT DETECTED Final   Chlamydophila pneumoniae NOT DETECTED NOT DETECTED Final   Mycoplasma pneumoniae NOT DETECTED NOT DETECTED Final    Comment: Performed at [facility], [place]. [address], [place],  [address]    FURTHER DISCHARGE INSTRUCTIONS:  Get Medicines reviewed and adjusted: Please take all your medications with you for your next visit with your Primary MD  Laboratory/radiological data: Please request your Primary MD to go over all hospital tests and procedure/radiological results at the follow up, please ask your Primary MD to get all Hospital records sent to his/her office.  In some cases, they will be blood work, cultures and biopsy results pending at the time of your discharge. Please request that your primary care M.D. goes through all the records of your hospital data and follows up on these results.  Also Note the following: If you experience worsening of your admission symptoms, develop shortness of breath, life threatening emergency, suicidal or homicidal thoughts you must seek medical attention immediately by calling 911 or calling your MD immediately  if symptoms less severe.  You must read complete instructions/literature along with all the possible adverse reactions/side effects for all the Medicines you take and that have been prescribed to you. Take any new Medicines after you have completely understood and accpet all the possible  adverse reactions/side effects.   Do not drive when taking Pain medications or sleeping medications (Benzodaizepines)  Do not take more than prescribed Pain, Sleep and Anxiety Medications. It is not advisable to combine anxiety,sleep and pain medications without talking with your primary care practitioner  Special Instructions: If you have smoked or chewed Tobacco  in the last 2 yrs please stop smoking, stop any regular Alcohol  and or any Recreational drug use.  Wear Seat belts while driving.  Please note: You were cared for by a hospitalist during your hospital stay. Once you are  discharged, your primary care physician will handle any further medical issues. Please note that NO REFILLS for any discharge medications will be authorized once you are discharged, as it is imperative that you return to your primary care physician (or establish a relationship with a primary care physician if you do not have one) for your post hospital discharge needs so that they can reassess your need for medications and monitor your lab values.  Total Time spent coordinating discharge including counseling, education and face to face time equals 35 minutes.  SignedOren Binet 09/17/2019 8:41 AM

## 2019-09-17 NOTE — Plan of Care (Signed)
Pt slept well during the night. No complaints of pain verbalized. Alert and oriented. Vitals stable on RA. Denies any dyspnea on exertion. Independent with ADLs. IV  SL. Able to regularly reposition in bed for pressure relief. Unable to tolerate prone positioning  overnight. No other issues, will monitor.   Problem: Education: Goal: Knowledge of risk factors and measures for prevention of condition will improve Outcome: Progressing   Problem: Respiratory: Goal: Complications related to the disease process, condition or treatment will be avoided or minimized Outcome: Progressing   Problem: Health Behavior/Discharge Planning: Goal: Ability to manage health-related needs will improve Outcome: Progressing

## 2019-09-18 LAB — CULTURE, BLOOD (ROUTINE X 2)
Culture: NO GROWTH
Culture: NO GROWTH
Special Requests: ADEQUATE
Special Requests: ADEQUATE

## 2019-10-19 ENCOUNTER — Other Ambulatory Visit: Payer: Self-pay

## 2020-02-18 ENCOUNTER — Ambulatory Visit: Payer: BC Managed Care – PPO | Attending: Internal Medicine

## 2020-02-18 ENCOUNTER — Ambulatory Visit: Payer: BC Managed Care – PPO

## 2020-02-18 DIAGNOSIS — Z23 Encounter for immunization: Secondary | ICD-10-CM

## 2020-02-18 NOTE — Progress Notes (Signed)
   Covid-19 Vaccination Clinic  Name:  Joy Phillips    MRN: [id number] DOB: [birth year]  02/18/2020  [name] was observed post Covid-19 immunization for 15 minutes without incident. She was provided with Vaccine Information Sheet and instruction to access the V-Safe system.   [name] was instructed to call 911 with any severe reactions post vaccine: Marland Kitchen Difficulty breathing  . Swelling of face and throat  . A fast heartbeat  . A bad rash all over body  . Dizziness and weakness   Immunizations Administered    Name Date Dose VIS Date Route   Pfizer COVID-19 Vaccine 02/18/2020 11:16 AM 0.3 mL 12/09/2018 Intramuscular   Manufacturer: [place]   Lot: [id number]   [place]: [id number]

## 2020-03-12 ENCOUNTER — Ambulatory Visit: Payer: BC Managed Care – PPO | Attending: Internal Medicine

## 2020-03-12 DIAGNOSIS — Z23 Encounter for immunization: Secondary | ICD-10-CM

## 2020-03-12 NOTE — Progress Notes (Signed)
   Covid-19 Vaccination Clinic  Name:  Joy Phillips    MRN: [id number] DOB: 07-08-1971  03/12/2020  [name] was observed post Covid-19 immunization for 15 minutes without incident. She was provided with Vaccine Information Sheet and instruction to access the V-Safe system.   [name] was instructed to call 911 with any severe reactions post vaccine: Marland Kitchen Difficulty breathing  . Swelling of face and throat  . A fast heartbeat  . A bad rash all over body  . Dizziness and weakness   Immunizations Administered    Name Date Dose VIS Date Route   Pfizer COVID-19 Vaccine 03/12/2020 10:46 AM 0.3 mL 12/09/2018 Intramuscular   Manufacturer: [place]   Lot: [id number]   [place]: [id number]

## 2020-03-15 ENCOUNTER — Ambulatory Visit: Payer: Self-pay

## 2020-06-06 ENCOUNTER — Other Ambulatory Visit: Payer: Self-pay | Admitting: Obstetrics & Gynecology

## 2020-06-06 DIAGNOSIS — Z1231 Encounter for screening mammogram for malignant neoplasm of breast: Secondary | ICD-10-CM

## 2020-06-23 ENCOUNTER — Other Ambulatory Visit: Payer: Self-pay | Admitting: Obstetrics & Gynecology

## 2020-06-23 ENCOUNTER — Ambulatory Visit
Admission: RE | Admit: 2020-06-23 | Discharge: 2020-06-23 | Disposition: A | Discharge status: Home / Self Care | Payer: 59 | Source: Ambulatory Visit | Attending: Obstetrics & Gynecology | Admitting: Obstetrics & Gynecology

## 2020-06-23 ENCOUNTER — Other Ambulatory Visit: Payer: Self-pay

## 2020-06-23 DIAGNOSIS — Z1231 Encounter for screening mammogram for malignant neoplasm of breast: Secondary | ICD-10-CM

## 2020-12-22 IMAGING — CT CT ABD-PELV W/ CM
2 of 5 series · 15 of 46 positions shown, 17 images · IV contrast (omnipaque)
Comparison: CT abdomen pelvis 12/15/2009.

CLINICAL DATA: Patient with history of VPSIW-NJ. Abdominal pain.
Evaluate for pulmonary embolus.

EXAM:
CT CHEST, ABDOMEN, AND PELVIS WITH CONTRAST
TECHNIQUE: Multidetector CT imaging of the chest, abdomen and pelvis was
performed following the standard protocol during bolus
administration of intravenous contrast.
CONTRAST:  100mL OMNIPAQUE IOHEXOL 350 MG/ML SOLN

[Series 3: axial st · axial · 0.73mm/px · z∈[-606,-201]mm · 12 of 93 slices shown, 14 images]
[im 6/93  soft-tissue]
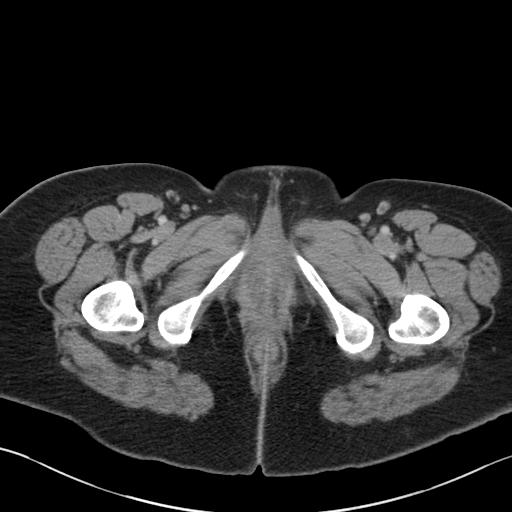
[im 6/93  bone]
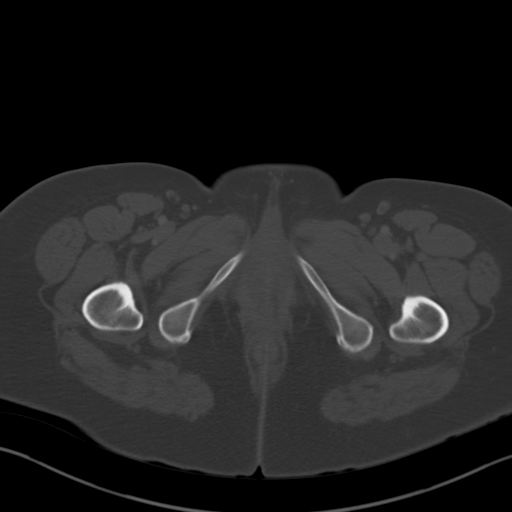
[im 12/93  soft-tissue]
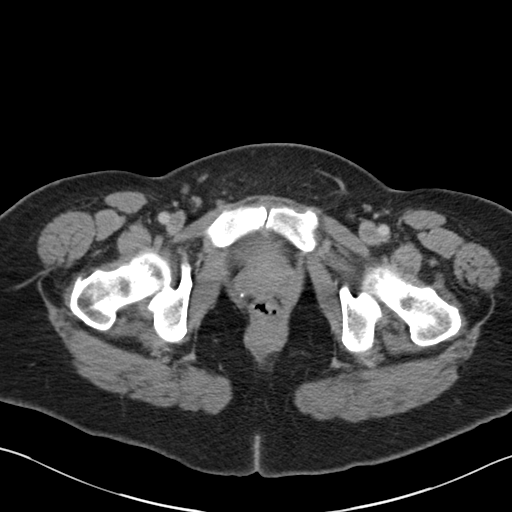
[im 24/93  soft-tissue]
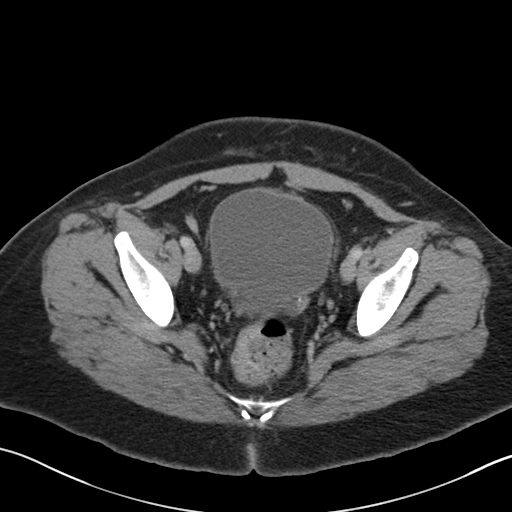
[im 29/93  soft-tissue]
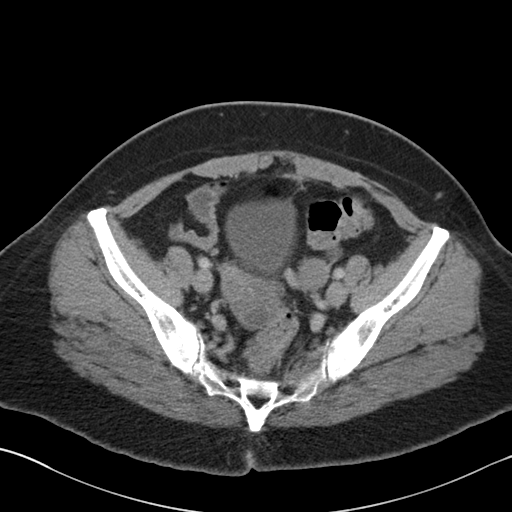
[im 35/93  soft-tissue]
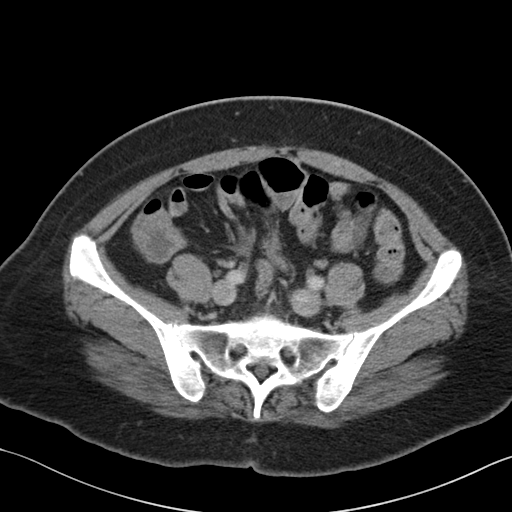
[im 41/93  soft-tissue]
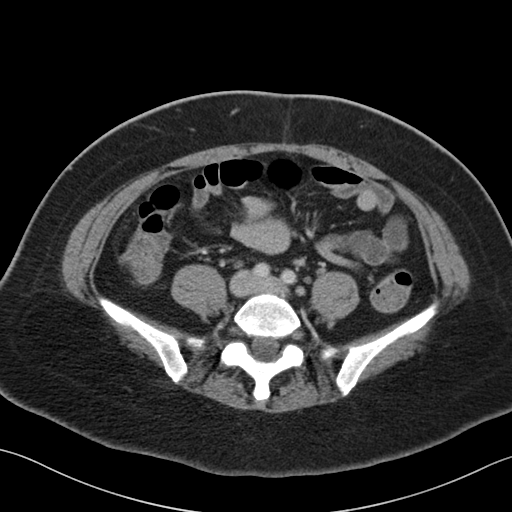
[im 52/93  soft-tissue]
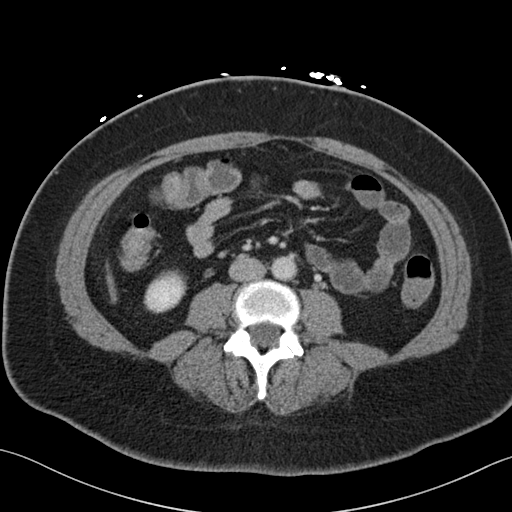
[im 58/93  soft-tissue]
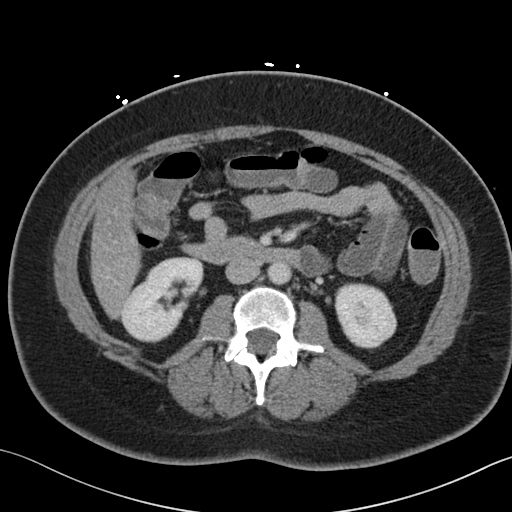
[im 64/93  soft-tissue]
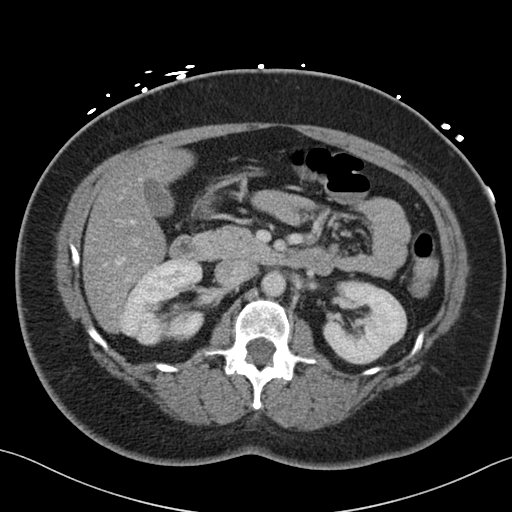
[im 64/93  bone]
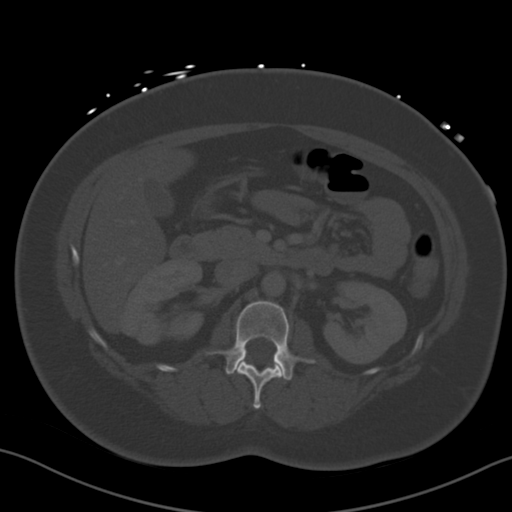
[im 70/93  soft-tissue]
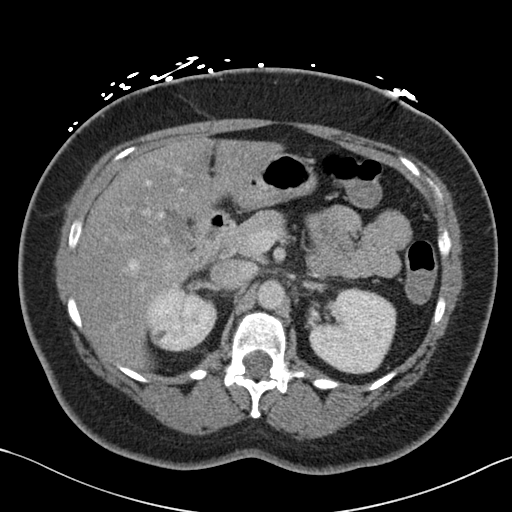
[im 81/93  soft-tissue]
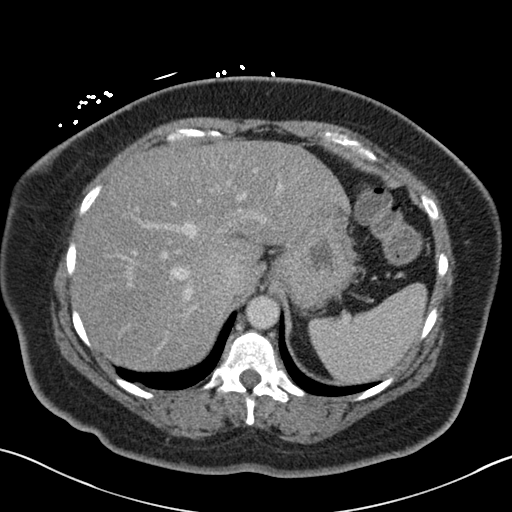
[im 87/93  soft-tissue]
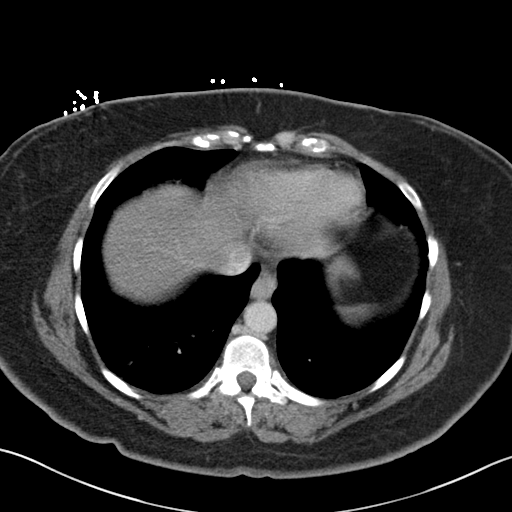

[Series 6: coronal st · coronal · 0.69mm/px · 3 of 98 slices shown]
[im 33/98  soft-tissue]
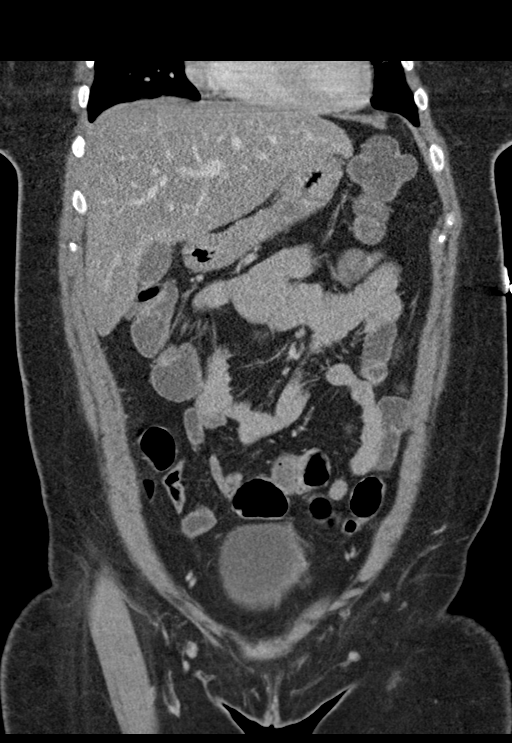
[im 44/98  soft-tissue]
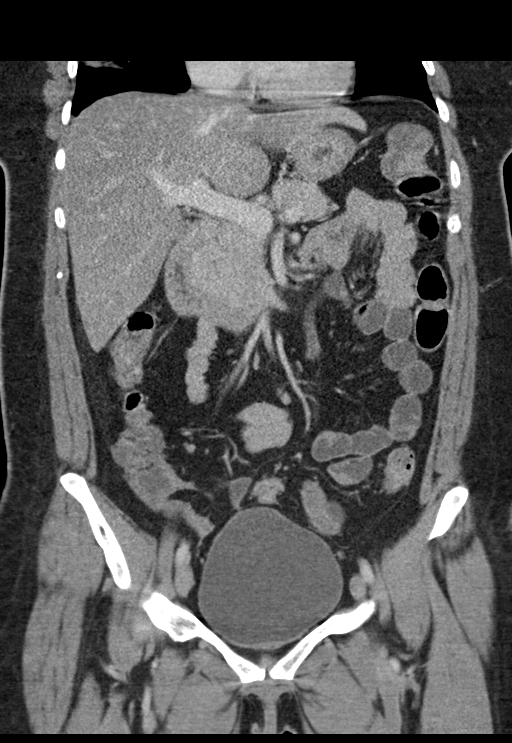
[im 54/98  soft-tissue]
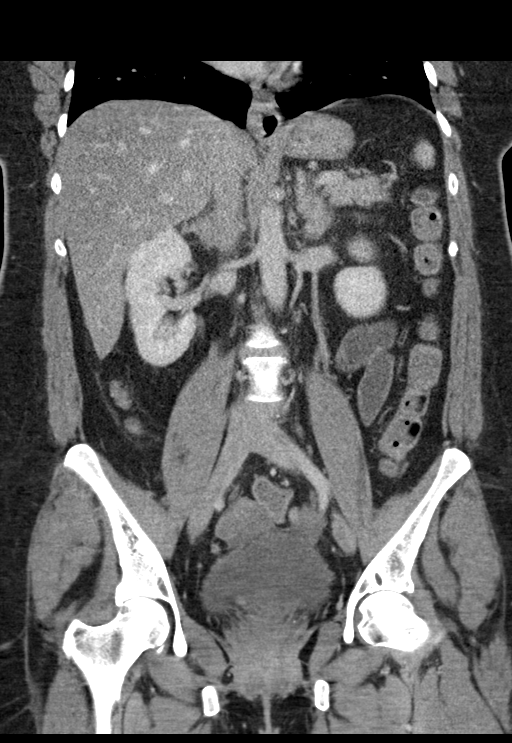

[15 of 46 positions shown; findings below may reference images not displayed]

FINDINGS: CT CHEST FINDINGS

Cardiovascular: Normal heart size. Aorta main pulmonary artery
normal in caliber. Adequate opacification of the pulmonary arterial
system. Motion artifact limits evaluation. Within the above
limitation, no intraluminal filling defect identified to suggest
acute pulmonary embolus.

Mediastinum/Nodes: No enlarged axillary, mediastinal or hilar
lymphadenopathy. Normal appearance of the esophagus.

Lungs/Pleura: Central airways are patent. Sharply marginated patchy
areas of ground-glass attenuation are demonstrated within the lungs
bilaterally. No pleural effusion or pneumothorax.

Musculoskeletal: No aggressive or acute appearing osseous lesions.

CT ABDOMEN PELVIS FINDINGS

Hepatobiliary: The liver is normal in size and contour. Gallbladder
is unremarkable. No intrahepatic or extrahepatic biliary ductal
dilatation.

Pancreas: Unremarkable

Spleen: Unremarkable

Adrenals/Urinary Tract: Normal adrenal glands. Kidneys enhance
symmetrically with contrast. Postsurgical changes involving the
midpole of the right kidney. Subcentimeter too small to characterize
low-attenuation lesion superior pole right kidney. Urinary bladder
is unremarkable.

Stomach/Bowel: There are a few fluid-filled loops of small bowel (2
cm) within the left hemiabdomen (image 45; series 3). No abnormal
bowel wall thickening. No free fluid or free intraperitoneal air.
Normal morphology of the stomach.

Vascular/Lymphatic: Normal caliber abdominal aorta. No
retroperitoneal lymphadenopathy.

Reproductive: Status post hysterectomy. The right ovary is enlarged
measuring up to 5.7 x 3.8 cm and contains multiple cystic
structures. Prominent follicle within the left ovary.

Other: None.

Musculoskeletal: Lumbar spine degenerative changes. No aggressive or
acute appearing osseous lesions.
IMPRESSION: 1. No evidence for acute pulmonary embolus.
2. Bilateral patchy areas of ground-glass and consolidation most
compatible with history of VPSIW-NJ.
3. Few fluid-filled loops of bowel within the left hemiabdomen are
nonspecific however may be secondary to ileus.
4. The right ovary is enlarged and contains multiple cystic
structures. This needs further evaluation pelvic ultrasound.

## 2020-12-23 IMAGING — US US PELVIS COMPLETE
2 series · 13 of 25 positions shown · non-contrast
Comparison: CT abdomen and pelvis 09/13/2019

CLINICAL DATA: Ovarian cyst

EXAM:
TRANSABDOMINAL ULTRASOUND OF PELVIS
TECHNIQUE: Transabdominal ultrasound examination of the pelvis was performed
including evaluation of the uterus, ovaries, adnexal regions, and
pelvic cul-de-sac.

[Series 1: us pelvis complete · 0.26mm/px · 12 of 62 slices shown (1 of 2)]
[im 1/62]
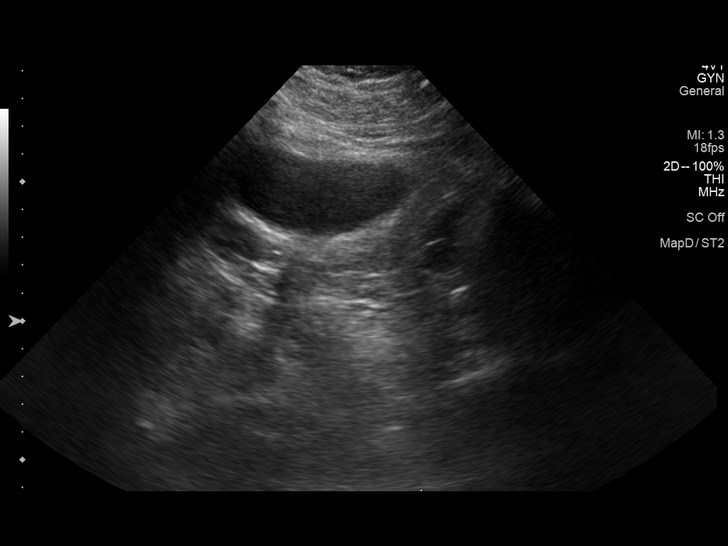
[im 6/62]
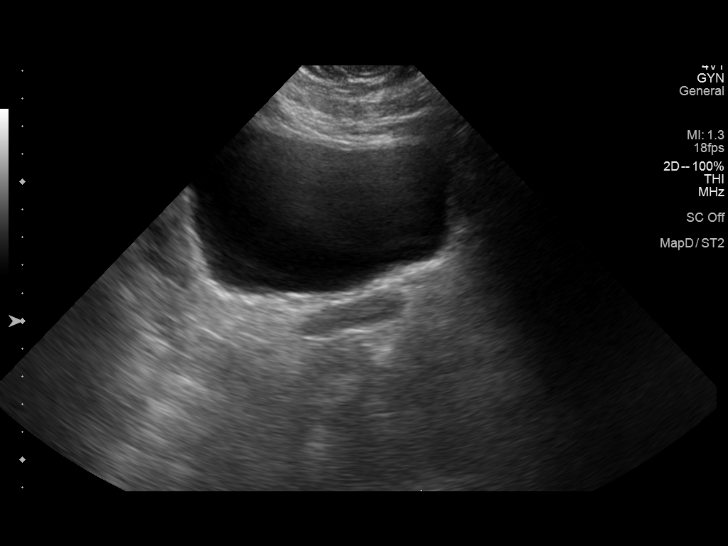
[im 11/62]
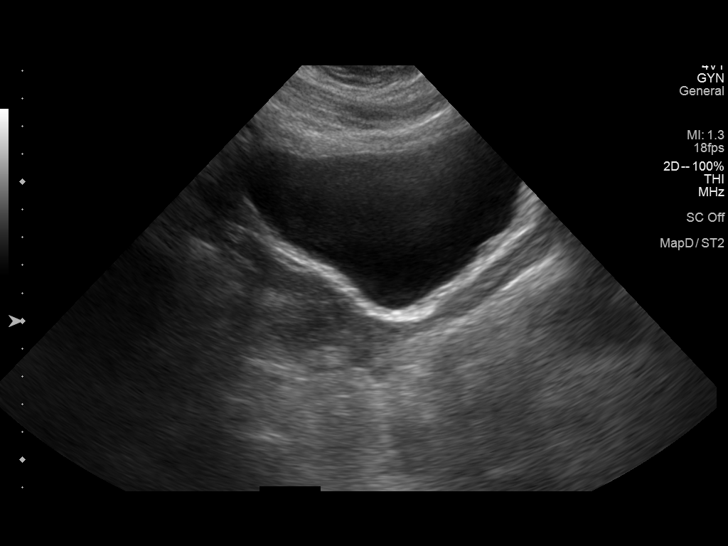
[im 16/62]
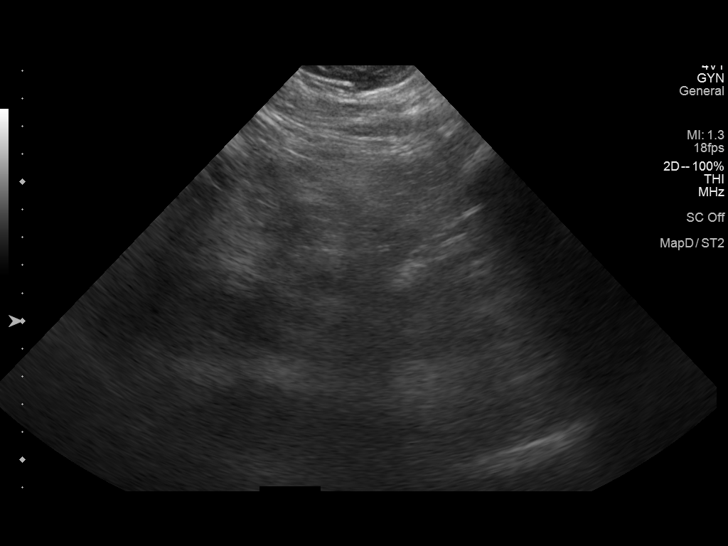
[im 22/62]
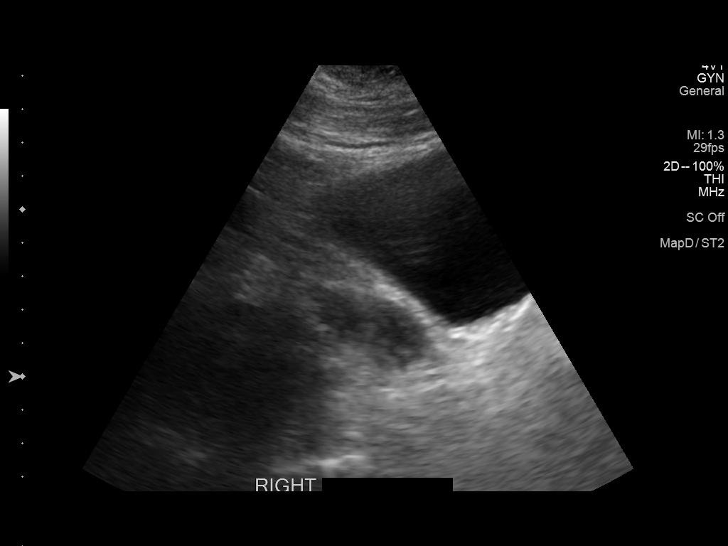
[im 27/62]
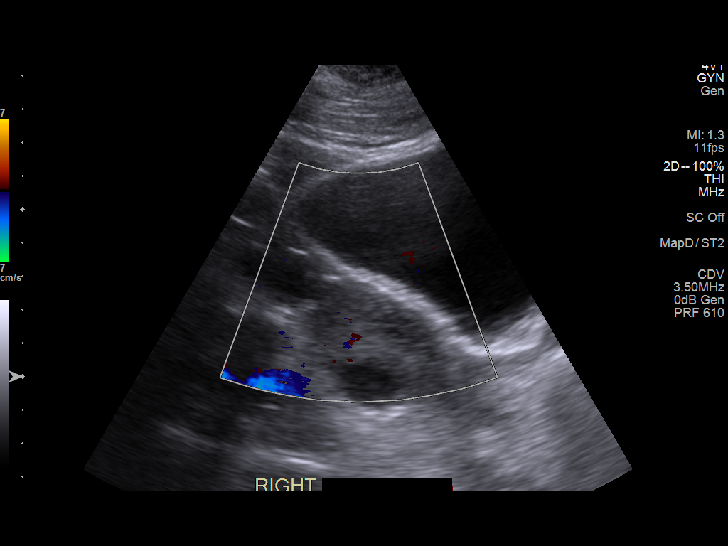
[im 32/62]
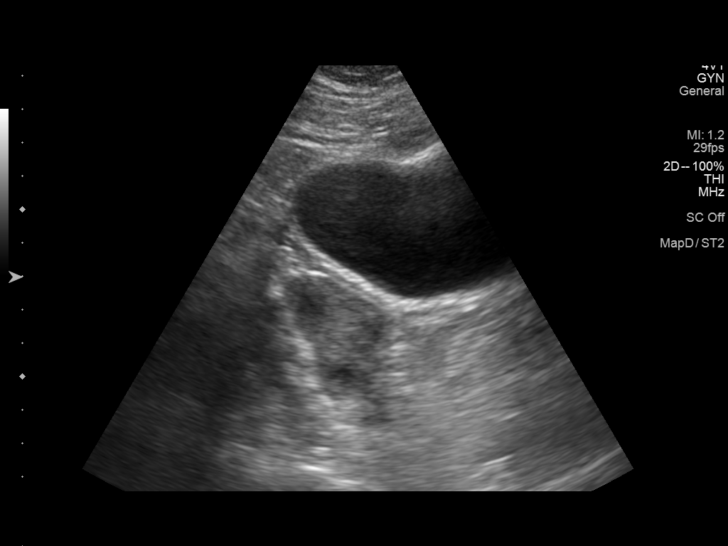
[im 38/62]
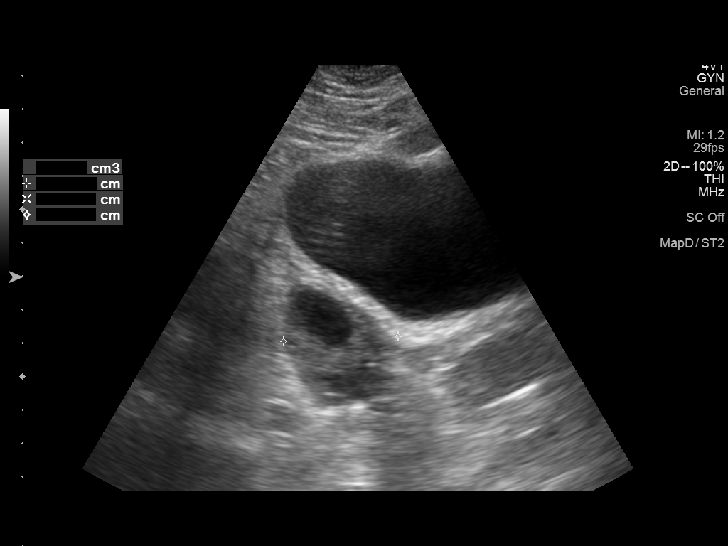
[im 43/62]
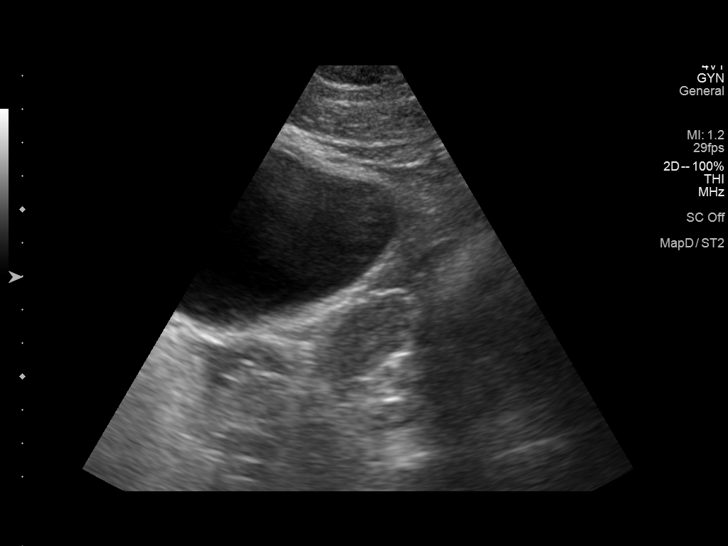
[im 48/62]
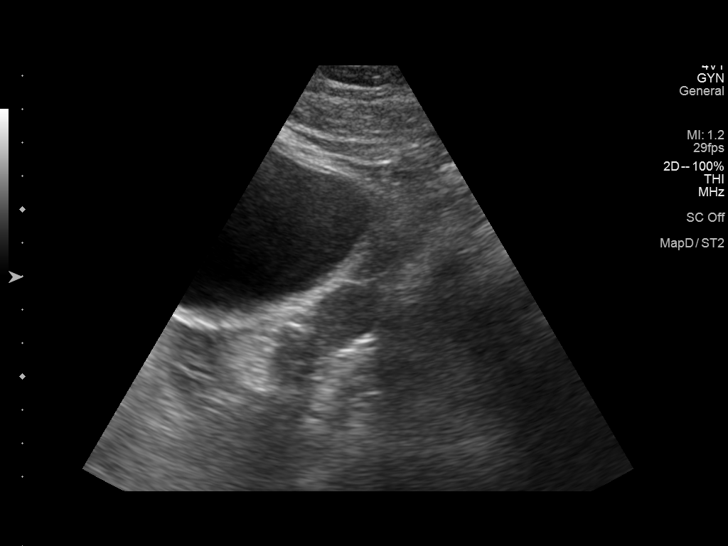
[im 54/62]
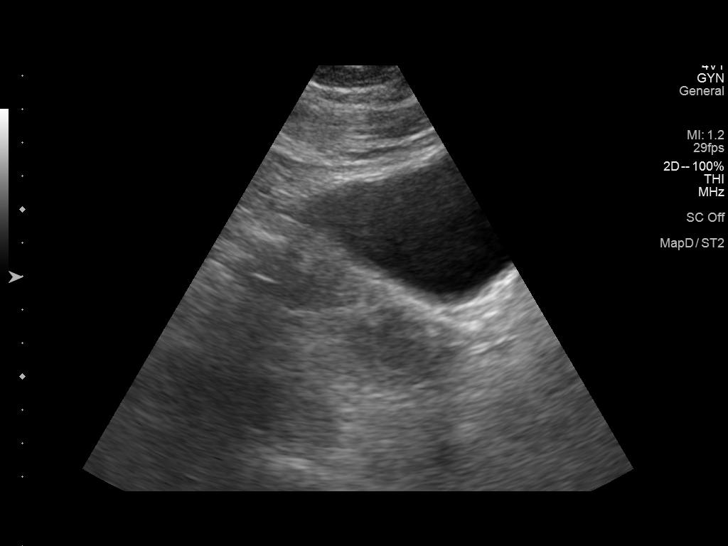
[im 59/62]
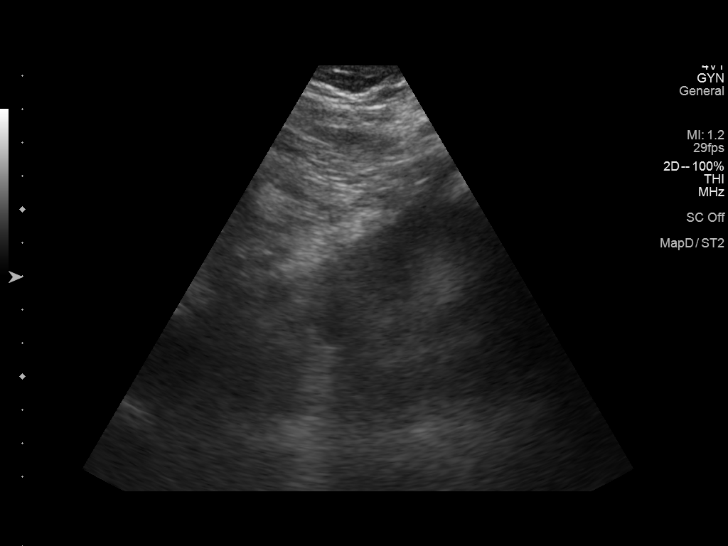

[Series 2: us pelvis complete · 0.21mm/px · 1 of 2 slices shown (2 of 2)]
[im 1/2]
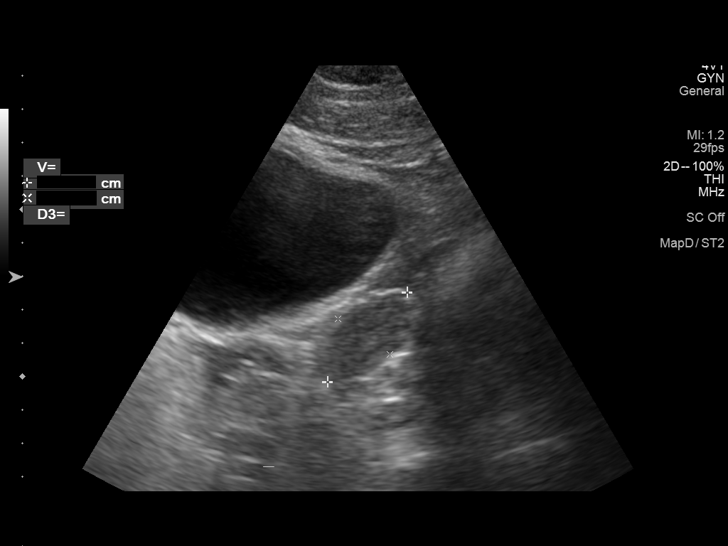

[13 of 25 positions shown; findings below may reference images not displayed]

FINDINGS: Uterus

Surgically absent

Endometrium

N/A

Right ovary

Measurements: 7.7 x 3.33.4 cm = volume: 46.2 mL. Complex appearing
RIGHT ovary containing 3 small cysts, largest 2.7 x 1.3 x 1.8 cm.
All 3 lesions contain scattered low level internal echogenicity. No
definite septations or mural nodularity. No solid mass.

Left ovary

Measurements: 3.6 x 1.9 x 3.3 cm = volume: 11.7 mL. Hypoechoic
nodule 1.1 x 2.3 x 1.4 cm, containing diffuse low level internal
echogenicity.

Other findings: No free pelvic fluid. No other adnexal masses.
Visualized bladder unremarkable.
IMPRESSION: Surgical absence of uterus.

Complicated cystic lesions in both ovaries, with 3 mildly
complicated cysts within the RIGHT ovary up to 2.7 cm in size and a
single complex hypoechoic nodule 2.3 cm greatest size within LEFT
ovary question hemorrhagic cyst.

No transvaginal imaging was ordered, which likely would provide
better characterization.

Short-interval follow up transabdominal and transvaginal ultrasound
in 6-12 weeks is recommended to reassess these lesions for character
and stability versus evolution.

## 2021-02-21 ENCOUNTER — Other Ambulatory Visit: Payer: Self-pay | Admitting: Obstetrics and Gynecology

## 2021-02-21 ENCOUNTER — Other Ambulatory Visit: Payer: Self-pay | Admitting: Obstetrics & Gynecology

## 2021-02-25 ENCOUNTER — Other Ambulatory Visit: Payer: Self-pay

## 2021-02-25 ENCOUNTER — Emergency Department
Admission: RE | Admit: 2021-02-25 | Discharge: 2021-02-25 | Disposition: A | Discharge status: Home / Self Care | Payer: Self-pay | Source: Ambulatory Visit

## 2021-02-25 VITALS — BP 154/90 | HR 79 | Temp 99.3°F

## 2021-02-25 DIAGNOSIS — N611 Abscess of the breast and nipple: Secondary | ICD-10-CM

## 2021-02-25 DIAGNOSIS — N61 Mastitis without abscess: Secondary | ICD-10-CM

## 2021-02-25 MED ORDER — SULFAMETHOXAZOLE-TRIMETHOPRIM 800-160 MG PO TABS: 1.0000 | ORAL_TABLET | Freq: Two times a day (BID) | ORAL | 0 refills | Status: AC

## 2021-02-25 NOTE — Discharge Instructions (Addendum)
Advised/instructed patient to take medication with food to completion.  Encourage patient to increase daily water intake while taking medication.  Advised/encouraged patient to leave affected area open, allow drainage/discharge, do not cover affected area with bandages, no topical antibiotic antibiotic ointment, no use of hydrogen peroxide.  Advised patient may wash area with soap and water while bathing and may express discharge or drainage of affected area while bathing.

## 2021-02-25 NOTE — ED Triage Notes (Signed)
Patient noticed an abscess on her right breast on Thursday.  Area is red with drainage.  Some pain.  Denies any pain meds.

## 2021-05-29 ENCOUNTER — Other Ambulatory Visit: Payer: Self-pay | Admitting: Obstetrics & Gynecology

## 2021-05-29 DIAGNOSIS — Z1231 Encounter for screening mammogram for malignant neoplasm of breast: Secondary | ICD-10-CM

## 2021-07-08 ENCOUNTER — Ambulatory Visit
Admission: RE | Admit: 2021-07-08 | Discharge: 2021-07-08 | Disposition: A | Discharge status: Home / Self Care | Payer: BC Managed Care – PPO | Source: Ambulatory Visit | Attending: Obstetrics & Gynecology | Admitting: Obstetrics & Gynecology

## 2021-07-08 DIAGNOSIS — Z1231 Encounter for screening mammogram for malignant neoplasm of breast: Secondary | ICD-10-CM

## 2021-07-17 ENCOUNTER — Ambulatory Visit: Payer: BC Managed Care – PPO

## 2021-07-19 ENCOUNTER — Ambulatory Visit: Payer: BC Managed Care – PPO

## 2021-10-20 ENCOUNTER — Ambulatory Visit
Admission: EM | Admit: 2021-10-20 | Discharge: 2021-10-20 | Disposition: A | Discharge status: Home / Self Care | Payer: BC Managed Care – PPO

## 2021-10-20 ENCOUNTER — Other Ambulatory Visit: Payer: Self-pay

## 2021-10-20 NOTE — ED Triage Notes (Signed)
Pt denies difficulty swallowing and swollen tonsils.

## 2021-10-20 NOTE — ED Triage Notes (Signed)
Pt presents with c/o a sore throat.  States it has been 1 week.   Pt presents with a productive cough.

## 2022-04-05 ENCOUNTER — Other Ambulatory Visit: Payer: Self-pay | Admitting: Obstetrics & Gynecology

## 2022-04-05 DIAGNOSIS — Z1231 Encounter for screening mammogram for malignant neoplasm of breast: Secondary | ICD-10-CM

## 2022-05-08 ENCOUNTER — Emergency Department (HOSPITAL_BASED_OUTPATIENT_CLINIC_OR_DEPARTMENT_OTHER)
Admission: EM | Admit: 2022-05-08 | Discharge: 2022-05-08 | Disposition: A | Discharge status: Home / Self Care | Payer: BC Managed Care – PPO | Attending: Emergency Medicine | Admitting: Emergency Medicine

## 2022-05-08 ENCOUNTER — Other Ambulatory Visit: Payer: Self-pay

## 2022-05-08 ENCOUNTER — Encounter (HOSPITAL_BASED_OUTPATIENT_CLINIC_OR_DEPARTMENT_OTHER): Payer: Self-pay | Admitting: Emergency Medicine

## 2022-05-08 ENCOUNTER — Emergency Department (HOSPITAL_BASED_OUTPATIENT_CLINIC_OR_DEPARTMENT_OTHER): Payer: BC Managed Care – PPO

## 2022-05-08 DIAGNOSIS — R109 Unspecified abdominal pain: Secondary | ICD-10-CM

## 2022-05-08 DIAGNOSIS — Z20822 Contact with and (suspected) exposure to covid-19: Secondary | ICD-10-CM | POA: Diagnosis not present

## 2022-05-08 DIAGNOSIS — R1084 Generalized abdominal pain: Secondary | ICD-10-CM | POA: Insufficient documentation

## 2022-05-08 DIAGNOSIS — Z7984 Long term (current) use of oral hypoglycemic drugs: Secondary | ICD-10-CM | POA: Insufficient documentation

## 2022-05-08 DIAGNOSIS — E1165 Type 2 diabetes mellitus with hyperglycemia: Secondary | ICD-10-CM

## 2022-05-08 DIAGNOSIS — I10 Essential (primary) hypertension: Secondary | ICD-10-CM

## 2022-05-08 LAB — CBC WITH DIFFERENTIAL/PLATELET
Abs Immature Granulocytes: 0.01 10*3/uL (ref 0.00–0.07)
Basophils Absolute: 0.1 10*3/uL (ref 0.0–0.1)
Basophils Relative: 1 %
Eosinophils Absolute: 0.2 10*3/uL (ref 0.0–0.5)
Eosinophils Relative: 3 %
HCT: 38.7 % (ref 36.0–46.0)
Hemoglobin: 13.4 g/dL (ref 12.0–15.0)
Immature Granulocytes: 0 %
Lymphocytes Relative: 45 %
Lymphs Abs: 2.5 10*3/uL (ref 0.7–4.0)
MCH: 30.4 pg (ref 26.0–34.0)
MCHC: 34.6 g/dL (ref 30.0–36.0)
MCV: 87.8 fL (ref 80.0–100.0)
Monocytes Absolute: 0.4 10*3/uL (ref 0.1–1.0)
Monocytes Relative: 8 %
Neutro Abs: 2.4 10*3/uL (ref 1.7–7.7)
Neutrophils Relative %: 43 %
Platelets: 169 10*3/uL (ref 150–400)
RBC: 4.41 MIL/uL (ref 3.87–5.11)
RDW: 11.8 % (ref 11.5–15.5)
WBC: 5.6 10*3/uL (ref 4.0–10.5)
nRBC: 0 % (ref 0.0–0.2)

## 2022-05-08 LAB — URINALYSIS, MICROSCOPIC (REFLEX)

## 2022-05-08 LAB — COMPREHENSIVE METABOLIC PANEL
ALT: 17 U/L (ref 0–44)
AST: 15 U/L (ref 15–41)
Albumin: 3.5 g/dL (ref 3.5–5.0)
Alkaline Phosphatase: 62 U/L (ref 38–126)
Anion gap: 6 (ref 5–15)
BUN: 10 mg/dL (ref 6–20)
CO2: 28 mmol/L (ref 22–32)
Calcium: 9 mg/dL (ref 8.9–10.3)
Chloride: 100 mmol/L (ref 98–111)
Creatinine, Ser: 0.82 mg/dL (ref 0.44–1.00)
GFR, Estimated: >60 mL/min (ref >60)
Glucose, Bld: 423 mg/dL — ABNORMAL HIGH (ref 70–99)
Potassium: 3.7 mmol/L (ref 3.5–5.1)
Sodium: 134 mmol/L — ABNORMAL LOW (ref 135–145)
Total Bilirubin: 0.6 mg/dL (ref 0.3–1.2)
Total Protein: 6.7 g/dL (ref 6.5–8.1)

## 2022-05-08 LAB — URINALYSIS, ROUTINE W REFLEX MICROSCOPIC
Bilirubin Urine: NEGATIVE
Glucose, UA: >=500 mg/dL — AB
Hgb urine dipstick: NEGATIVE
Ketones, ur: NEGATIVE mg/dL
Leukocytes,Ua: NEGATIVE
Nitrite: NEGATIVE
Protein, ur: NEGATIVE mg/dL
Specific Gravity, Urine: 1.015 (ref 1.005–1.030)
pH: 7 (ref 5.0–8.0)

## 2022-05-08 LAB — SARS CORONAVIRUS 2 BY RT PCR: SARS Coronavirus 2 by RT PCR: NEGATIVE

## 2022-05-08 LAB — LIPASE, BLOOD: Lipase: 43 U/L (ref 11–51)

## 2022-05-08 MED ORDER — KETOROLAC TROMETHAMINE 30 MG/ML IJ SOLN
30.0000 mg | Freq: Once | INTRAMUSCULAR | Status: AC
  Administered 2022-05-08: 30 mg via INTRAVENOUS
  Filled 2022-05-08: qty 1

## 2022-05-08 MED ORDER — HYDRALAZINE HCL 10 MG PO TABS
10.0000 mg | ORAL_TABLET | Freq: Once | ORAL | Status: DC
  Filled 2022-05-08: qty 1

## 2022-05-08 MED ORDER — METFORMIN HCL 500 MG PO TABS: 1000.0000 mg | ORAL_TABLET | Freq: Two times a day (BID) | ORAL | 0 refills | Status: AC

## 2022-05-08 MED ORDER — HYDROCHLOROTHIAZIDE 25 MG PO TABS: 25.0000 mg | ORAL_TABLET | Freq: Every day | ORAL | 0 refills | Status: AC

## 2022-05-08 NOTE — ED Provider Notes (Addendum)
[facility] EMERGENCY DEPARTMENT Provider Note   CSN: [id number] Arrival date & time: 05/08/22  0535     History  Chief Complaint  Patient presents with   Abdominal Pain    Joy Phillips is a 50 y.o. female.  The history is provided by the patient.  Abdominal Pain Pain location:  Generalized Pain quality: sharp   Pain radiates to:  Does not radiate Pain severity:  Moderate Onset quality:  Gradual Duration: days. Timing:  Constant Progression:  Unchanged Chronicity:  New Context: not alcohol use   Relieved by:  Nothing Worsened by:  Nothing Ineffective treatments:  None tried Associated symptoms: no anorexia   Risk factors: no alcohol abuse        Home Medications Prior to Admission medications   Medication Sig Start Date End Date Taking? Authorizing Provider  Albuterol Sulfate 108 (90 Base) MCG/ACT AEPB Inhale 1-2 puffs into the lungs 4 (four) times daily as needed (for SOB).    [provider]  Ascorbic Acid 500 MG CAPS Take 2 tablets by mouth daily. 01/09/10   [provider]  blood glucose meter kit and supplies Dispense based on patient and insurance preference. Use up to four times daily as directed. (FOR ICD-10 E10.9, E11.9). 09/17/19   Ghimire, Shanker M, MD  Insulin Detemir (LEVEMIR FLEXTOUCH) 100 UNIT/ML Pen Inject 22 Units into the skin at bedtime. 09/17/19 10/17/19  Ghimire, Shanker M, MD  metFORMIN (GLUCOPHAGE) 500 MG tablet Take 1 tablet (500 mg total) by mouth 2 (two) times daily with a meal. 09/17/19 09/16/20  Ghimire, Shanker M, MD  predniSONE (DELTASONE) 10 MG tablet Take 40 mg daily for 1 day, 30 mg daily for 1 day, 20 mg daily for 1 days,10 mg daily for 1 day, then stop 09/17/19   Ghimire, Shanker M, MD      Allergies    Patient has no known allergies.    Review of Systems   Review of Systems  Gastrointestinal:  Positive for abdominal pain. Negative for anorexia.    Physical Exam Updated Vital Signs BP (!) 207/112   Pulse  100   Temp 98.3 F (36.8 C) (Oral)   Resp 18   Ht 5' 6" (1.676 m)   Wt 90.7 kg   LMP 03/21/2013   SpO2 98%   BMI 32.28 kg/m  Physical Exam  ED Results / Procedures / Treatments   Labs (all labs ordered are listed, but only abnormal results are displayed) Results for orders placed or performed during the hospital encounter of 05/08/22  CBC with Differential  Result Value Ref Range   WBC 5.6 4.0 - 10.5 K/uL   RBC 4.41 3.87 - 5.11 MIL/uL   Hemoglobin 13.4 12.0 - 15.0 g/dL   HCT 38.7 36.0 - 46.0 %   MCV 87.8 80.0 - 100.0 fL   MCH 30.4 26.0 - 34.0 pg   MCHC 34.6 30.0 - 36.0 g/dL   RDW 11.8 11.5 - 15.5 %   Platelets 169 150 - 400 K/uL   nRBC 0.0 0.0 - 0.2 %   Neutrophils Relative % 43 %   Neutro Abs 2.4 1.7 - 7.7 K/uL   Lymphocytes Relative 45 %   Lymphs Abs 2.5 0.7 - 4.0 K/uL   Monocytes Relative 8 %   Monocytes Absolute 0.4 0.1 - 1.0 K/uL   Eosinophils Relative 3 %   Eosinophils Absolute 0.2 0.0 - 0.5 K/uL   Basophils Relative 1 %   Basophils Absolute 0.1 0.0 -   0.1 K/uL   Immature Granulocytes 0 %   Abs Immature Granulocytes 0.01 0.00 - 0.07 K/uL  Comprehensive metabolic panel  Result Value Ref Range   Sodium 134 (L) 135 - 145 mmol/L   Potassium 3.7 3.5 - 5.1 mmol/L   Chloride 100 98 - 111 mmol/L   CO2 28 22 - 32 mmol/L   Glucose, Bld 423 (H) 70 - 99 mg/dL   BUN 10 6 - 20 mg/dL   Creatinine, Ser 0.82 0.44 - 1.00 mg/dL   Calcium 9.0 8.9 - 10.3 mg/dL   Total Protein 6.7 6.5 - 8.1 g/dL   Albumin 3.5 3.5 - 5.0 g/dL   AST 15 15 - 41 U/L   ALT 17 0 - 44 U/L   Alkaline Phosphatase 62 38 - 126 U/L   Total Bilirubin 0.6 0.3 - 1.2 mg/dL   GFR, Estimated >60 >60 mL/min   Anion gap 6 5 - 15  Urinalysis, Routine w reflex microscopic Urine, Clean Catch  Result Value Ref Range   Color, Urine YELLOW YELLOW   APPearance CLEAR CLEAR   Specific Gravity, Urine 1.015 1.005 - 1.030   pH 7.0 5.0 - 8.0   Glucose, UA >=500 (A) NEGATIVE mg/dL   Hgb urine dipstick NEGATIVE NEGATIVE    Bilirubin Urine NEGATIVE NEGATIVE   Ketones, ur NEGATIVE NEGATIVE mg/dL   Protein, ur NEGATIVE NEGATIVE mg/dL   Nitrite NEGATIVE NEGATIVE   Leukocytes,Ua NEGATIVE NEGATIVE  Lipase, blood  Result Value Ref Range   Lipase 43 11 - 51 U/L  Urinalysis, Microscopic (reflex)  Result Value Ref Range   RBC / HPF 0-5 0 - 5 RBC/hpf   WBC, UA 0-5 0 - 5 WBC/hpf   Bacteria, UA FEW (A) NONE SEEN   Squamous Epithelial / LPF 0-5 0 - 5   Non Squamous Epithelial PRESENT (A) NONE SEEN   Budding Yeast PRESENT    CT Renal Stone Study  Result Date: 05/08/2022 CLINICAL DATA:  50-year-old female with history of sharp abdominal pain for the past 2 days with diarrhea. EXAM: CT ABDOMEN AND PELVIS WITHOUT CONTRAST TECHNIQUE: Multidetector CT imaging of the abdomen and pelvis was performed following the standard protocol without IV contrast. RADIATION DOSE REDUCTION: This exam was performed according to the departmental dose-optimization program which includes automated exposure control, adjustment of the mA and/or kV according to patient size and/or use of iterative reconstruction technique. COMPARISON:  CT the abdomen and pelvis 09/13/2019. FINDINGS: Lower chest: Unremarkable. Hepatobiliary: Diffuse low attenuation throughout the hepatic parenchyma, indicative of a background of hepatic steatosis. No definite suspicious cystic or solid hepatic lesions are confidently identified on today's noncontrast CT examination. Unenhanced appearance of the gallbladder is normal. Pancreas: No definite pancreatic mass or peripancreatic fluid collections or inflammatory changes are noted on today's noncontrast CT examination. Spleen: Unremarkable. Adrenals/Urinary Tract: No calcifications are identified within the collecting system of either kidney, along the course of either ureter, or within the lumen of the urinary bladder. Cortical scarring in the upper pole of the right kidney. Unenhanced appearance of the left kidney and bilateral  adrenal glands is otherwise normal. No hydroureteronephrosis. Urinary bladder is normal in appearance. Stomach/Bowel: The unenhanced appearance of the stomach is normal. No pathologic dilatation of small bowel or colon. Normal appendix. Vascular/Lymphatic: Atherosclerotic calcifications in the abdominal aorta and pelvic vasculature. No lymphadenopathy noted in the abdomen or pelvis. Reproductive: Status post hysterectomy. Ovaries are unremarkable in appearance. Other: No significant volume of ascites.  No pneumoperitoneum. Musculoskeletal: There are   no aggressive appearing lytic or blastic lesions noted in the visualized portions of the skeleton. IMPRESSION: 1. No acute findings are noted in the abdomen or pelvis to account for the patient's symptoms. 2. Hepatic steatosis. 3. Aortic atherosclerosis. 4. Cortical scarring in the upper pole of the right kidney, presumably sequela of prior infection or infarction. Electronically Signed   By: Daniel  Entrikin M.D.   On: 05/08/2022 06:12      Radiology CT Renal Stone Study  Result Date: 05/08/2022 CLINICAL DATA:  50-year-old female with history of sharp abdominal pain for the past 2 days with diarrhea. EXAM: CT ABDOMEN AND PELVIS WITHOUT CONTRAST TECHNIQUE: Multidetector CT imaging of the abdomen and pelvis was performed following the standard protocol without IV contrast. RADIATION DOSE REDUCTION: This exam was performed according to the departmental dose-optimization program which includes automated exposure control, adjustment of the mA and/or kV according to patient size and/or use of iterative reconstruction technique. COMPARISON:  CT the abdomen and pelvis 09/13/2019. FINDINGS: Lower chest: Unremarkable. Hepatobiliary: Diffuse low attenuation throughout the hepatic parenchyma, indicative of a background of hepatic steatosis. No definite suspicious cystic or solid hepatic lesions are confidently identified on today's noncontrast CT examination. Unenhanced  appearance of the gallbladder is normal. Pancreas: No definite pancreatic mass or peripancreatic fluid collections or inflammatory changes are noted on today's noncontrast CT examination. Spleen: Unremarkable. Adrenals/Urinary Tract: No calcifications are identified within the collecting system of either kidney, along the course of either ureter, or within the lumen of the urinary bladder. Cortical scarring in the upper pole of the right kidney. Unenhanced appearance of the left kidney and bilateral adrenal glands is otherwise normal. No hydroureteronephrosis. Urinary bladder is normal in appearance. Stomach/Bowel: The unenhanced appearance of the stomach is normal. No pathologic dilatation of small bowel or colon. Normal appendix. Vascular/Lymphatic: Atherosclerotic calcifications in the abdominal aorta and pelvic vasculature. No lymphadenopathy noted in the abdomen or pelvis. Reproductive: Status post hysterectomy. Ovaries are unremarkable in appearance. Other: No significant volume of ascites.  No pneumoperitoneum. Musculoskeletal: There are no aggressive appearing lytic or blastic lesions noted in the visualized portions of the skeleton. IMPRESSION: 1. No acute findings are noted in the abdomen or pelvis to account for the patient's symptoms. 2. Hepatic steatosis. 3. Aortic atherosclerosis. 4. Cortical scarring in the upper pole of the right kidney, presumably sequela of prior infection or infarction. Electronically Signed   By: Daniel  Entrikin M.D.   On: 05/08/2022 06:12    Procedures Procedures    Medications Ordered in ED Medications  ketorolac (TORADOL) 30 MG/ML injection 30 mg (30 mg Intravenous Given 05/08/22 0645)    ED Course/ Medical Decision Making/ A&P                           Medical Decision Making 2 days of loos stool and abdominal pain   Problems Addressed: Primary hypertension:    Details: follow up with your PMD for ongoing care  Amount and/or Complexity of Data  Reviewed External Data Reviewed: notes.    Details: previous notes reviewed Labs: ordered.    Details: all labs reviewed:  elevated glucose 423, with normal anion gap. Lipase normal.  Normal LFTs.  Normal white count and normal hemoglobin.  UTI negative for UTI Radiology: ordered and independent interpretation performed.    Details: negative CT abdomen  Risk Prescription drug management. Risk Details: Patient with abdominal pain with negative labs and CT.  No covid.  Well appearing.    BP decreased on own without intervention.  Will add back metformin for better glucose control.  Will start HCTZ until patient can be seen .  Follow up with PMD and nephro team at [facility].  Strict return precautions    Final Clinical Impression(s) / ED Diagnoses Final diagnoses:  None   Return for intractable cough, coughing up blood, fevers > 100.4 unrelieved by medication, shortness of breath, intractable vomiting, chest pain, shortness of breath, weakness, numbness, changes in speech, facial asymmetry, abdominal pain, passing out, Inability to tolerate liquids or food, cough, altered mental status or any concerns. No signs of systemic illness or infection. The patient is nontoxic-appearing on exam and vital signs are within normal limits.  I have reviewed the triage vital signs and the nursing notes. Pertinent labs & imaging results that were available during my care of the patient were reviewed by me and considered in my medical decision making (see chart for details). After history, exam, and medical workup I feel the patient has been appropriately medically screened and is safe for discharge home. Pertinent diagnoses were discussed with the patient. Patient was given return precautions.  Rx / DC Orders ED Discharge Orders     None         Palumbo, April, MD 05/08/22 0700    Palumbo, April, MD 05/08/22 0708  

## 2022-05-08 NOTE — ED Triage Notes (Signed)
Pt c/o sharp abd pain x 2 days. Pt endorses diarrhea with cough.

## 2022-07-09 ENCOUNTER — Ambulatory Visit: Payer: BC Managed Care – PPO

## 2022-07-26 ENCOUNTER — Ambulatory Visit: Payer: BC Managed Care – PPO
